# Patient Record
Sex: Male | Born: 1975 | Race: White | Hispanic: No | Marital: Married | Smoking: Current every day smoker
Health system: Southern US, Community
[De-identification: ages and names within clinical notes are randomized; demographics above are authoritative.]

## PROBLEM LIST (undated history)

## (undated) DIAGNOSIS — F329 Major depressive disorder, single episode, unspecified: Secondary | ICD-10-CM

## (undated) DIAGNOSIS — F32A Depression, unspecified: Secondary | ICD-10-CM

## (undated) DIAGNOSIS — K4090 Unilateral inguinal hernia, without obstruction or gangrene, not specified as recurrent: Secondary | ICD-10-CM

## (undated) DIAGNOSIS — K219 Gastro-esophageal reflux disease without esophagitis: Secondary | ICD-10-CM

## (undated) DIAGNOSIS — F1910 Other psychoactive substance abuse, uncomplicated: Secondary | ICD-10-CM

## (undated) HISTORY — PX: HERNIA REPAIR: SHX51

---

## 1997-11-03 ENCOUNTER — Emergency Department (HOSPITAL_COMMUNITY): Admission: EM | Admit: 1997-11-03 | Discharge: 1997-11-03 | Payer: Self-pay | Admitting: Emergency Medicine

## 2009-03-28 ENCOUNTER — Emergency Department (HOSPITAL_COMMUNITY): Admission: EM | Admit: 2009-03-28 | Discharge: 2009-03-29 | Payer: Self-pay | Admitting: Emergency Medicine

## 2009-10-23 ENCOUNTER — Emergency Department (HOSPITAL_COMMUNITY): Admission: EM | Admit: 2009-10-23 | Discharge: 2009-10-23 | Payer: Self-pay | Admitting: Emergency Medicine

## 2010-08-08 LAB — CBC
HCT: 49.2 % (ref 39.0–52.0)
Hemoglobin: 17.1 g/dL — ABNORMAL HIGH (ref 13.0–17.0)
MCHC: 34.8 g/dL (ref 30.0–36.0)
MCV: 93.2 fL (ref 78.0–100.0)
Platelets: 229 10*3/uL (ref 150–400)
RBC: 5.27 MIL/uL (ref 4.22–5.81)
RDW: 13 % (ref 11.5–15.5)
WBC: 6.4 10*3/uL (ref 4.0–10.5)

## 2010-08-08 LAB — LACTIC ACID, PLASMA: Lactic Acid, Venous: 2.3 mmol/L — ABNORMAL HIGH (ref 0.5–2.2)

## 2010-08-08 LAB — URINALYSIS, ROUTINE W REFLEX MICROSCOPIC
Bilirubin Urine: NEGATIVE
Glucose, UA: NEGATIVE mg/dL
Hgb urine dipstick: NEGATIVE
Ketones, ur: NEGATIVE mg/dL
Nitrite: NEGATIVE
Protein, ur: NEGATIVE mg/dL
Specific Gravity, Urine: 1.027 (ref 1.005–1.030)
Urobilinogen, UA: 0.2 mg/dL (ref 0.0–1.0)
pH: 7 (ref 5.0–8.0)

## 2010-08-08 LAB — COMPREHENSIVE METABOLIC PANEL
ALT: 17 U/L (ref 0–53)
Albumin: 4.4 g/dL (ref 3.5–5.2)
Calcium: 9 mg/dL (ref 8.4–10.5)
GFR calc Af Amer: >60 mL/min (ref >60)
Glucose, Bld: 97 mg/dL (ref 70–99)
Potassium: 4.2 mEq/L (ref 3.5–5.1)
Sodium: 142 mEq/L (ref 135–145)
Total Protein: 7.2 g/dL (ref 6.0–8.3)

## 2010-08-08 LAB — ABO/RH: ABO/RH(D): O NEG

## 2010-08-08 LAB — POCT I-STAT, CHEM 8
BUN: 3 mg/dL — ABNORMAL LOW (ref 6–23)
Calcium, Ion: 1.05 mmol/L — ABNORMAL LOW (ref 1.12–1.32)
Creatinine, Ser: 1.1 mg/dL (ref 0.4–1.5)
Hemoglobin: 18.7 g/dL — ABNORMAL HIGH (ref 13.0–17.0)
TCO2: 25 mmol/L (ref 0–100)

## 2010-08-08 LAB — TYPE AND SCREEN
ABO/RH(D): O NEG
Antibody Screen: NEGATIVE

## 2010-08-08 LAB — ETHANOL: Alcohol, Ethyl (B): 264 mg/dL — ABNORMAL HIGH (ref 0–10)

## 2010-08-08 LAB — PROTIME-INR: Prothrombin Time: 13.3 seconds (ref 11.6–15.2)

## 2010-08-21 ENCOUNTER — Emergency Department (HOSPITAL_COMMUNITY)
Admission: EM | Admit: 2010-08-21 | Discharge: 2010-08-21 | Disposition: A | Discharge status: Home / Self Care | Payer: Self-pay | Attending: Emergency Medicine | Admitting: Emergency Medicine

## 2010-08-21 ENCOUNTER — Emergency Department (HOSPITAL_COMMUNITY): Payer: Self-pay

## 2010-08-21 DIAGNOSIS — S0003XA Contusion of scalp, initial encounter: Secondary | ICD-10-CM | POA: Insufficient documentation

## 2010-08-21 DIAGNOSIS — S0990XA Unspecified injury of head, initial encounter: Secondary | ICD-10-CM | POA: Insufficient documentation

## 2010-08-21 DIAGNOSIS — Y929 Unspecified place or not applicable: Secondary | ICD-10-CM | POA: Insufficient documentation

## 2010-08-21 DIAGNOSIS — W19XXXA Unspecified fall, initial encounter: Secondary | ICD-10-CM | POA: Insufficient documentation

## 2010-08-21 DIAGNOSIS — IMO0002 Reserved for concepts with insufficient information to code with codable children: Secondary | ICD-10-CM | POA: Insufficient documentation

## 2010-08-21 DIAGNOSIS — S0083XA Contusion of other part of head, initial encounter: Secondary | ICD-10-CM | POA: Insufficient documentation

## 2010-08-21 DIAGNOSIS — R51 Headache: Secondary | ICD-10-CM | POA: Insufficient documentation
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
## 2012-02-06 ENCOUNTER — Emergency Department (HOSPITAL_COMMUNITY)
Admission: EM | Admit: 2012-02-06 | Discharge: 2012-02-06 | Disposition: A | Discharge status: Home / Self Care | Payer: Self-pay | Attending: Emergency Medicine | Admitting: Emergency Medicine

## 2012-02-06 ENCOUNTER — Encounter (HOSPITAL_COMMUNITY): Payer: Self-pay | Admitting: Emergency Medicine

## 2012-02-06 DIAGNOSIS — R112 Nausea with vomiting, unspecified: Secondary | ICD-10-CM | POA: Insufficient documentation

## 2012-02-06 DIAGNOSIS — F17200 Nicotine dependence, unspecified, uncomplicated: Secondary | ICD-10-CM | POA: Insufficient documentation

## 2012-02-06 DIAGNOSIS — K4090 Unilateral inguinal hernia, without obstruction or gangrene, not specified as recurrent: Secondary | ICD-10-CM

## 2012-02-06 DIAGNOSIS — K4020 Bilateral inguinal hernia, without obstruction or gangrene, not specified as recurrent: Secondary | ICD-10-CM | POA: Insufficient documentation

## 2012-02-06 MED ORDER — HYDROCODONE-ACETAMINOPHEN 5-325 MG PO TABS: 1.0000 | ORAL_TABLET | Freq: Four times a day (QID) | ORAL | Status: DC | PRN

## 2012-02-06 NOTE — ED Provider Notes (Signed)
History     CSN: [id number]  Arrival date & time 02/06/12  1601   First MD Initiated Contact with Patient 02/06/12 1659      Chief Complaint  Patient presents with  . Groin Pain    (Consider location/radiation/quality/duration/timing/severity/associated sxs/prior treatment) Patient is a 36 y.o. male presenting with groin pain. The history is provided by the patient.  Groin Pain This is a new problem. The current episode started more than 1 week ago (About 2-1/2 weeks ago.). The problem has not changed since onset.Associated symptoms include abdominal pain. Pertinent negatives include no chest pain, no headaches and no shortness of breath.   Patient status post bilateral hernia repairs as a child please it was somewhere around 70 months of age. Over the past 2 weeks he's noted bilateral groin swelling with abdominal discomfort. States it has been some episodes of nausea and has vomited 4 or 5 times. States the bulges to go down somewhat he lays down. Pain at worse is an 8/10. Pain described as an ache with occasional sharp pain. Nonradiating.  History reviewed. No pertinent past medical history.  Past Surgical History  Procedure Date  . Hernia repair     when he was a child    History reviewed. No pertinent family history.  History  Substance Use Topics  . Smoking status: Current Every Day Smoker -- 1.0 packs/day for 15 years    Types: Cigarettes  . Smokeless tobacco: Not on file  . Alcohol Use: Yes     occassionally      Review of Systems  Constitutional: Negative for fever.  HENT: Negative for neck pain.   Eyes: Positive for redness.  Respiratory: Negative for shortness of breath.   Cardiovascular: Negative for chest pain.  Gastrointestinal: Positive for abdominal pain.  Genitourinary: Negative for dysuria, hematuria and testicular pain.  Musculoskeletal: Negative for back pain.  Skin: Negative for rash.  Neurological: Negative for headaches.  Hematological: Does  not bruise/bleed easily.    Allergies  Review of patient's allergies indicates no known allergies.  Home Medications   Current Outpatient Rx  Name Route Sig Dispense Refill  . BC HEADACHE POWDER PO Oral Take 1 packet by mouth daily as needed.    Marland Kitchen HYDROCODONE-ACETAMINOPHEN 5-325 MG PO TABS Oral Take 1-2 tablets by mouth every 6 (six) hours as needed for pain. 14 tablet 0    BP 143/84  Pulse 96  Temp 98.5 F (36.9 C) (Oral)  Resp 18  SpO2 96%  Physical Exam  Nursing note and vitals reviewed. Constitutional: He is oriented to person, place, and time. He appears well-developed and well-nourished.  HENT:  Head: Normocephalic and atraumatic.  Mouth/Throat: Oropharynx is clear and moist.  Eyes: Conjunctivae normal and EOM are normal. Pupils are equal, round, and reactive to light.  Neck: Normal range of motion. Neck supple.  Cardiovascular: Normal rate, regular rhythm and normal heart sounds.   Pulmonary/Chest: He is in respiratory distress.  Abdominal: Soft. Bowel sounds are normal. There is no tenderness.  Genitourinary: Penis normal.       Testes nontender no mass. Bilateral inguinal swelling easily reducible does not project down into the inguinal canal. Consistent with early bilateral hernias.  Musculoskeletal: Normal range of motion. He exhibits no edema.  Neurological: He is alert and oriented to person, place, and time. No cranial nerve deficit. He exhibits normal muscle tone. Coordination normal.  Skin: Skin is warm. No rash noted.    ED Course  Procedures (including critical  care time)   Labs Reviewed  URINALYSIS, ROUTINE W REFLEX MICROSCOPIC   No results found.   1. Left groin hernia   2. Right groin hernia       MDM   Patient with bilateral inguinal groin bulges hernia does not come down into the inguinal canal they are easily reducible no evidence of incarceration. They may not even be at the point where they require surgical repair however will refer  patient to general surgery for consultation.        Shelda Jakes, MD 02/06/12 1739

## 2012-02-06 NOTE — ED Notes (Addendum)
Pt.ststaed that he has L lower abdominal/groin pain with a bulge in that area.  Pain in the region for approximately one week and a half. Noticed initially in shower and appeared larger on one side than other.  Pain at this time stabbing and comes and goes.  Pt. rates pain at this time at "6" out of "10".  Some redness to area noted,  Pt. does not appear in great discomfort.

## 2012-02-10 ENCOUNTER — Ambulatory Visit (INDEPENDENT_AMBULATORY_CARE_PROVIDER_SITE_OTHER): Payer: Self-pay | Admitting: General Surgery

## 2012-02-10 ENCOUNTER — Encounter (HOSPITAL_COMMUNITY): Payer: Self-pay

## 2012-02-10 ENCOUNTER — Emergency Department (HOSPITAL_COMMUNITY)
Admission: EM | Admit: 2012-02-10 | Discharge: 2012-02-10 | Disposition: A | Discharge status: Home / Self Care | Payer: Self-pay | Attending: Emergency Medicine | Admitting: Emergency Medicine

## 2012-02-10 DIAGNOSIS — F17200 Nicotine dependence, unspecified, uncomplicated: Secondary | ICD-10-CM | POA: Insufficient documentation

## 2012-02-10 DIAGNOSIS — K4090 Unilateral inguinal hernia, without obstruction or gangrene, not specified as recurrent: Secondary | ICD-10-CM | POA: Insufficient documentation

## 2012-02-10 HISTORY — DX: Unilateral inguinal hernia, without obstruction or gangrene, not specified as recurrent: K40.90

## 2012-02-10 NOTE — ED Notes (Signed)
Patient given discharge instructions, information, prescriptions, and diet order. Patient states that they adequately understand discharge information given and to return to ED if symptoms return or worsen.     

## 2012-02-10 NOTE — ED Notes (Signed)
Patient reports that he ws seen at [facility] 5 days ago and stated that the hernia was reduced. Patient states he was referred to a surgeon and can not be seen until 02/20/12. Patient states he has also had vomiting x 3 and diarrhea today. Patient reports that the pain is worse and the area of pain has increased.

## 2012-02-10 NOTE — ED Provider Notes (Signed)
History     CSN: [id number]  Arrival date & time 02/10/12  1408   First MD Initiated Contact with Patient 02/10/12 1803      Chief Complaint  Patient presents with  . Inguinal Hernia    (Consider location/radiation/quality/duration/timing/severity/associated sxs/prior treatment) HPI  36 year old male with hx of inguinal hernia that he has since 32 month of age, and has had prior hernia surgical repair.  He is here c/o pain to L inguinal region for 1-2 weeks.  Describe pain as a sharp and throbbing sensation lasting 10-15 min to 30-40 min.  Pain is associated with a bulge, which improves when he lays down.  He also endorse several bouts of nausea, vomiting, and diaphoresis with associated pain.  He denies fever, chills, cp, sob, back pain, testicle pain, dysuria, or rash.  Was seen in ED 5 days ago for same.  Was diagnosed with inguinal hernia and recommend f/u with [facility].  Pt sts he does not have insurance, and unable to pay for the copay for follow up. He also mentioned that pain has progressed.  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  Pt has tried pain medication prescribed without relief.    Past Medical History  Diagnosis Date  . Inguinal hernia     Past Surgical History  Procedure Date  . Hernia repair     when he was a child    History reviewed. No pertinent family history.  History  Substance Use Topics  . Smoking status: Current Every Day Smoker -- 1.0 packs/day for 15 years    Types: Cigarettes  . Smokeless tobacco: Never Used  . Alcohol Use: Yes     occassionally      Review of Systems  All other systems reviewed and are negative.    Allergies  Review of patient's allergies indicates no known allergies.  Home Medications   Current Outpatient Rx  Name Route Sig Dispense Refill  . BC HEADACHE POWDER PO Oral Take 1 packet by mouth daily as needed.    Marland Kitchen  HYDROCODONE-ACETAMINOPHEN 5-325 MG PO TABS Oral Take 1-2 tablets by mouth every 6 (six) hours as needed.      BP 125/67  Pulse 67  Temp 98.1 F (36.7 C) (Oral)  Resp 16  SpO2 100%  Physical Exam  Nursing note and vitals reviewed. Constitutional: He appears well-developed and well-nourished. No distress.       Awake, alert, nontoxic appearance  HENT:  Head: Atraumatic.  Eyes: Conjunctivae normal are normal. Right eye exhibits no discharge. Left eye exhibits no discharge.  Neck: Normal range of motion. Neck supple.  Cardiovascular: Normal rate and regular rhythm.   Pulmonary/Chest: Effort normal. No respiratory distress. He exhibits no tenderness.  Abdominal: Soft. There is no tenderness. There is no rebound. A hernia is present. Hernia confirmed positive in the left inguinal area (reducible direct inguinal hernia, ttp.  ).    Genitourinary: Testes normal and penis normal. Right testis shows no swelling and no tenderness. Left testis shows no swelling and no tenderness. Circumcised. No penile tenderness.  Musculoskeletal: He exhibits no tenderness.       ROM appears intact, no obvious focal weakness  Lymphadenopathy:       Right: Inguinal adenopathy present.       Left: Inguinal adenopathy present.  Neurological: He is alert.  Skin: Skin is warm and dry. No rash  noted.  Psychiatric: He has a normal mood and affect.    ED Course  Procedures (including critical care time)  Labs Reviewed - No data to display No results found.   No diagnosis found.  1. L inguinal hernia  MDM  Pt has direct L inguinal hernia that is spontaneously reducible.  Hernia noticeable only when coughed.  No GU involvement.  Pt however had increased pain x 1-2 weeks and unable to afford copay to be seen by [facility].  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    6:57 PM I discussed with my attending, who has evaluated pt and felt that pt should only f/u with [facility] for  further management.  Pt does not meet criteria for admission at this time. There is no redness or warmth overlying the hernia. Therefore, will refer to [facility] for further care. I continue to stress no heavy lifting until seen by surgery.  Pt stable to be d/c at this time.   BP 125/67  Pulse 67  Temp 98.1 F (36.7 C) (Oral)  Resp 16  SpO2 100%  Nursing notes reviewed and considered in documentation  Previous records reviewed and considered      Fayrene Helper, PA-C 02/10/12 1900

## 2012-02-10 NOTE — ED Provider Notes (Signed)
Patient with left sided inguinal hernia. Reports he last vomited 2 days ago. Last bowel movement this morning which was loose. On exam patient is alert nontoxic. Abdomen soft nontender there is a left-sided inguinal hernia which reduces spontaneously when patient lies supine. There is no redness or warmth overlying the hernia and no signs of incarceration genitalia normal male, circumcised Plan referral [facility]  Doug Sou, MD 02/10/12 1859

## 2012-02-10 NOTE — ED Notes (Signed)
Pt sts he has noticed that the past 3 weeks he has been having trouble in his left inguinal area. Sts he noticed the pain 2 weeks ago that has gotten worse. Patient sts he had episodes of nausea and vomiting this past Saturday but none since. Pt sts he has been seen at [facility] but doesn't have the funds to pay upfront.

## 2012-02-11 ENCOUNTER — Encounter (INDEPENDENT_AMBULATORY_CARE_PROVIDER_SITE_OTHER): Payer: Self-pay | Admitting: Surgery

## 2012-02-11 NOTE — ED Provider Notes (Signed)
Medical screening examination/treatment/procedure(s) were conducted as a shared visit with non-physician practitioner(s) and myself.  I personally evaluated the patient during the encounter  Doug Sou, MD 02/11/12 0030

## 2012-02-12 ENCOUNTER — Ambulatory Visit (INDEPENDENT_AMBULATORY_CARE_PROVIDER_SITE_OTHER): Payer: Self-pay | Admitting: General Surgery

## 2012-02-12 ENCOUNTER — Encounter (INDEPENDENT_AMBULATORY_CARE_PROVIDER_SITE_OTHER): Payer: Self-pay | Admitting: General Surgery

## 2012-02-12 VITALS — BP 136/84 | HR 79 | Temp 98.0°F | Resp 18 | Ht 74.0 in | Wt 157.8 lb

## 2012-02-12 DIAGNOSIS — K4090 Unilateral inguinal hernia, without obstruction or gangrene, not specified as recurrent: Secondary | ICD-10-CM

## 2012-02-12 NOTE — Progress Notes (Signed)
Patient ID: Adam Pierce, male   DOB: 04-16-1976, 36 y.o.   MRN: [id number]  Chief Complaint  Patient presents with  . New Evaluation    lft herina    HPI Adam Pierce is a 36 y.o. male presents for evaluation of L groin bulge x 1 month. He first noticed the mass in the shower. One week later he developed pain and enlargement of the mass. 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 He admits to nausea, worsening pain and emesis. He denies fever and constipation. He has been out of work due to pain and fatigue for 10 days. He presented to the [facility] ED 5 days ago where he was told he had a L inguinal hernia, prescribed 14 Vicodin and advised to f/u with general surgery. He does have a history of open RIH as a child.    HPI  Past Medical History  Diagnosis Date  . Inguinal hernia     Past Surgical History  Procedure Date  . Hernia repair 1978    6-8 mos     No family history on file.  Social History History  Substance Use Topics  . Smoking status: Current Every Day Smoker -- 1.0 packs/day for 15 years    Types: Cigarettes  . Smokeless tobacco: Never Used  . Alcohol Use: Yes     occassionally    No Known Allergies  Current Outpatient Prescriptions  Medication Sig Dispense Refill  . HYDROcodone-acetaminophen (NORCO/VICODIN) 5-325 MG per tablet Take 1-2 tablets by mouth every 6 (six) hours as needed.      . Aspirin-Salicylamide-Caffeine (BC HEADACHE POWDER PO) Take 1 packet by mouth daily as needed.        Review of Systems Review of Systems All other review of systems negative or noncontributory except as stated in the HPI Blood pressure 136/84, pulse 79, temperature 98 F (36.7 C), temperature source Oral, resp. rate 18, height 6\' 2"  (1.88 m), weight 157 lb 12.8 oz (71.578 kg).  Physical Exam Physical Exam Physical Exam  Vitals reviewed. Constitutional: He is oriented to person, place, and time. He appears well-developed and well-nourished.  No distress.  HENT:  Head: Normocephalic and atraumatic.  Mouth/Throat: No oropharyngeal exudate.  Eyes: Conjunctivae and EOM are normal. Pupils are equal, round, and reactive to light. Right eye exhibits no discharge. Left eye exhibits no discharge. No scleral icterus.  Neck: Normal range of motion. No tracheal deviation present.  Cardiovascular: Normal rate, regular rhythm and normal heart sounds.   Pulmonary/Chest: Effort normal and breath sounds normal. No stridor. No respiratory distress. He has no wheezes. He has no rales. He exhibits no tenderness.  Abdominal: Soft. Bowel sounds are normal. He exhibits no distension and no mass. There is no tenderness. There is no rebound and no guarding. he has a small LIH on exam and whss on right but no evidence of recurrence of RIH. Musculoskeletal: Normal range of motion. He exhibits no edema and no tenderness.  Neurological: He is alert and oriented to person, place, and time.  Skin: Skin is warm and dry. No rash noted. He is not diaphoretic. No erythema. No pallor.  Psychiatric: He has a normal mood and affect. His behavior is normal. Judgment and thought content normal.    Data Reviewed   Assessment    Left inguinal hernia-reducible He has a fairly small and reducible left inguinal hernia on exam and I'm not entirely certain that this is the source of  all his complaints. This could also be a groin strain an incidentally found inguinal hernia. He is very thin and it is difficult to differentiate  ininguinal hernia versus cough impulse. He has no visible bulge but do think that he has a small hernia on exam. I discussed with him the options for continued observation versus surgical repair and I think that we will likely come to surgery. However, I think it is reasonable to wait for a that a few weeks to see if this improves with rest and NSAIDs. If this is a groin strain, this should improve whereas her hernia would continue to manifest. We  discussed the procedure of open repair versus laparoscopic repair and he is interested in the most economical repair which in this case would be open.  We discussed the risks of procedure including nerve injury and numbness and chronic pain, injury to the vas deferens the testicle and recurrence.  He expressed understanding and we will see how he does with a few weeks of rest. He will call me back if he would like to schedule open left inguinal hernia repair   Plan    Rest and NSAIDs (with food) x1 week and he will let me know if he would like to have repair.       Lodema Pilot DAVID 02/12/2012, 3:24 PM

## 2012-02-15 IMAGING — CT CT HEAD W/O CM
1 series · 16 of 30 positions shown, 20 images · non-contrast
Comparison: 03/29/2009

CLINICAL DATA: Fell with laceration and contusion on the forehead.
Swelling on the back of the head.

CT HEAD WITHOUT CONTRAST
TECHNIQUE: Contiguous axial images were obtained from the base of
the skull through the vertex without contrast.

[Series 2: head trauma 4.8 h37s · axial · 0.43mm/px · z∈[-116,+38]mm · 16 of 36 slices shown, 20 images]
[im 2/36  brain]
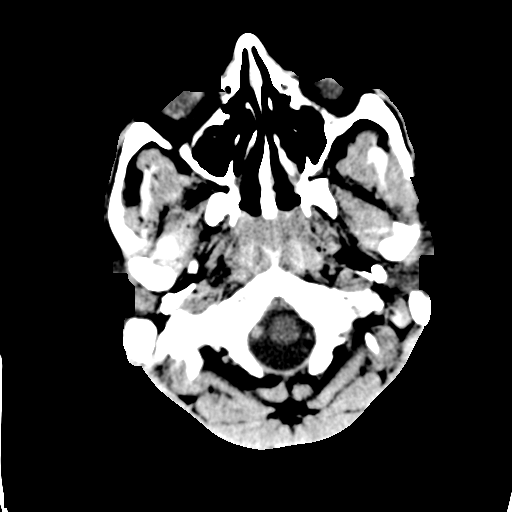
[im 2/36  bone]
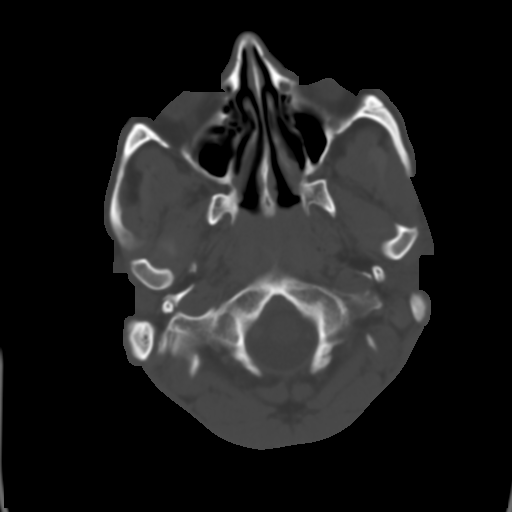
[im 4/36  brain]
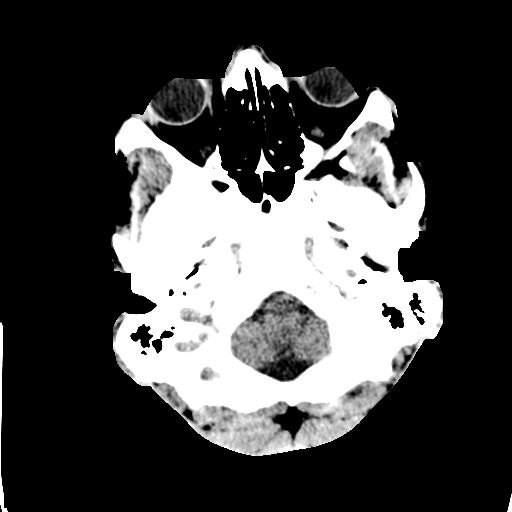
[im 7/36  brain]
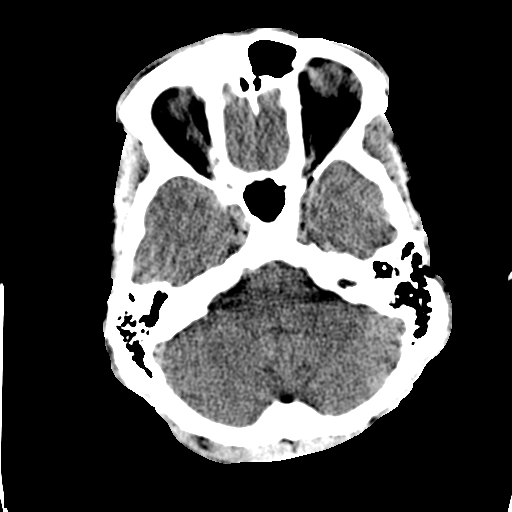
[im 9/36  brain]
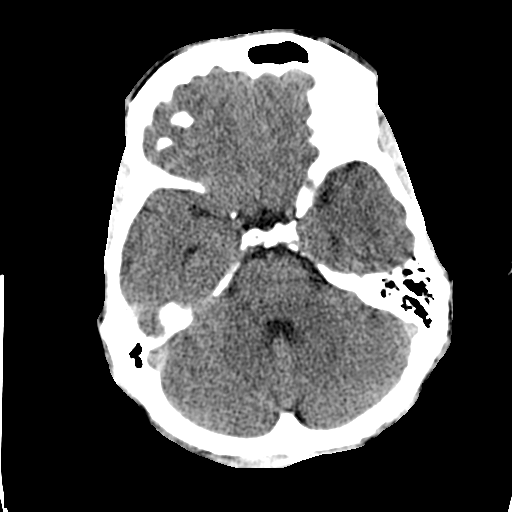
[im 10/36  brain]
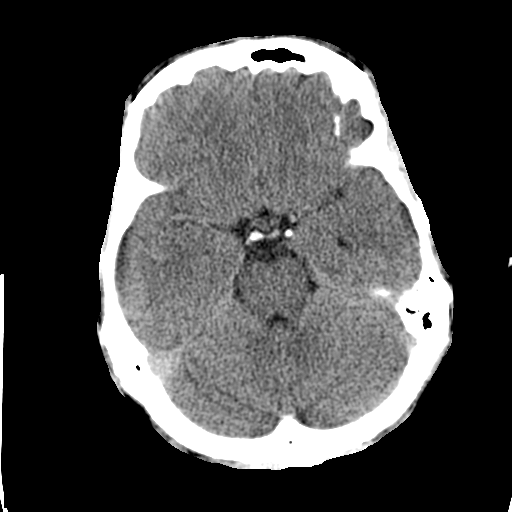
[im 10/36  bone]
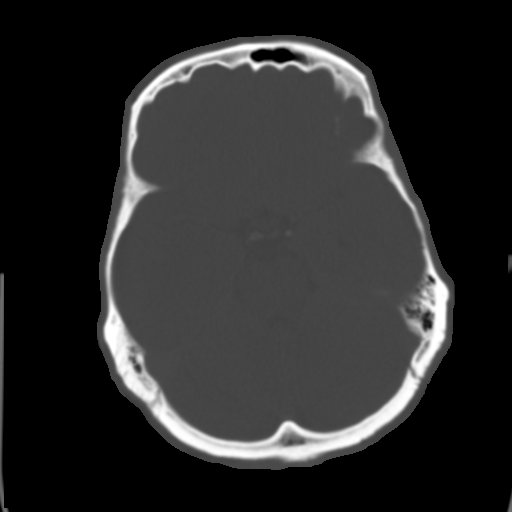
[im 13/36  brain]
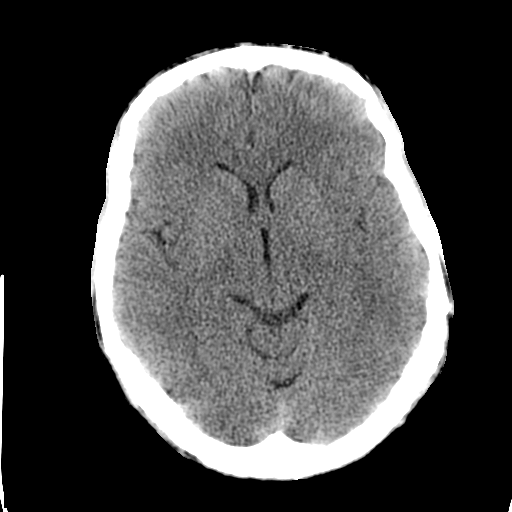
[im 15/36  brain]
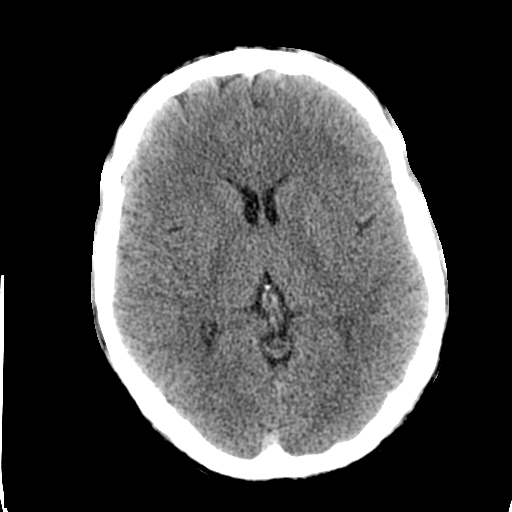
[im 17/36  brain]
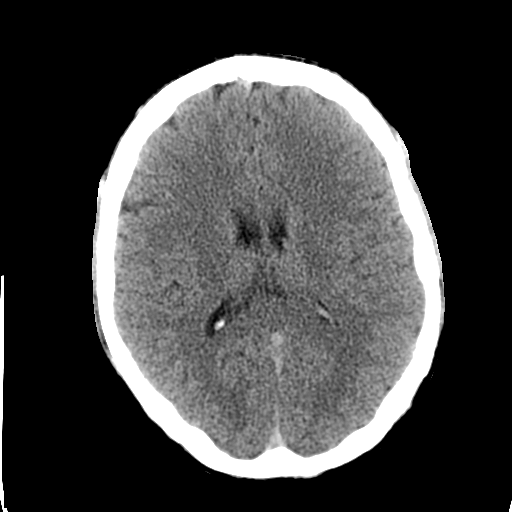
[im 19/36  brain]
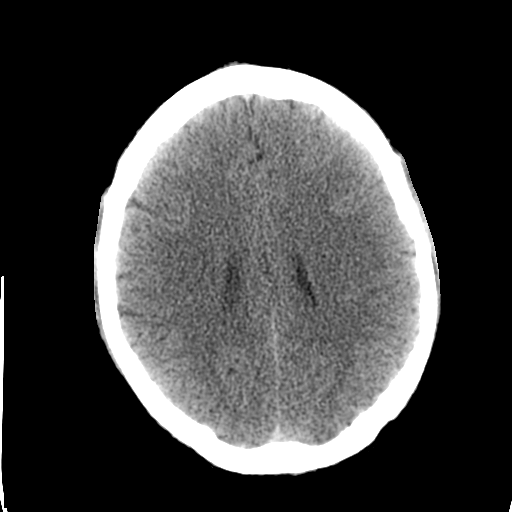
[im 19/36  bone]
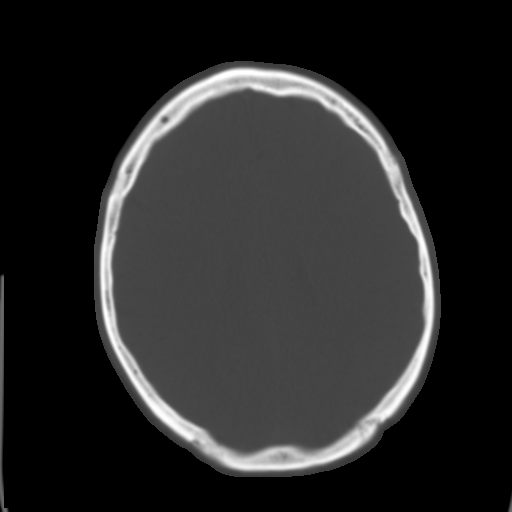
[im 21/36  brain]
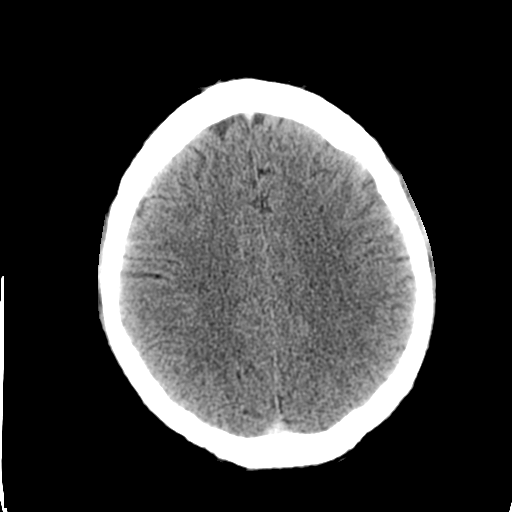
[im 23/36  brain]
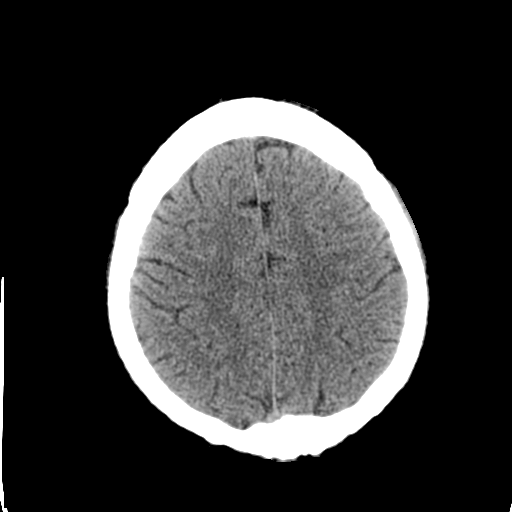
[im 26/36  brain]
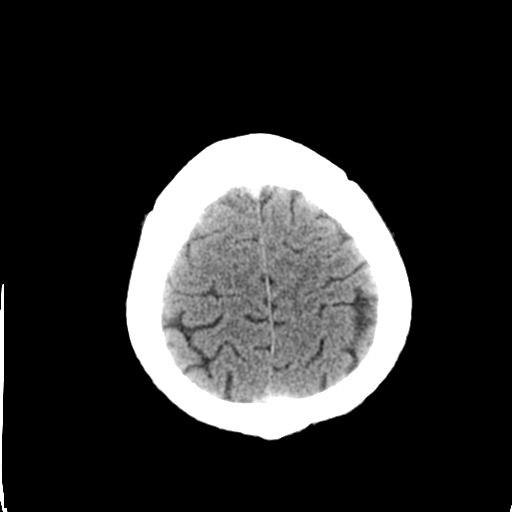
[im 27/36  brain]
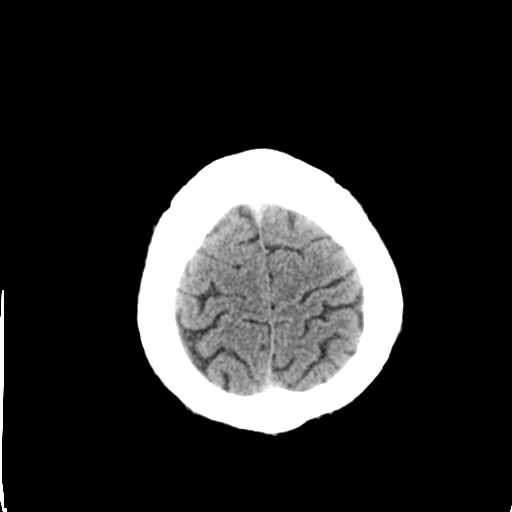
[im 27/36  bone]
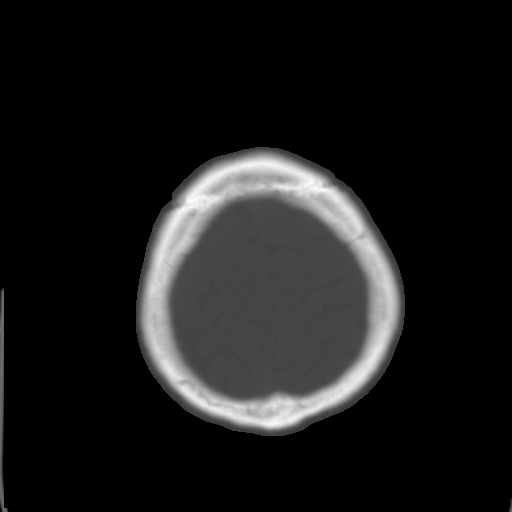
[im 29/36  brain]
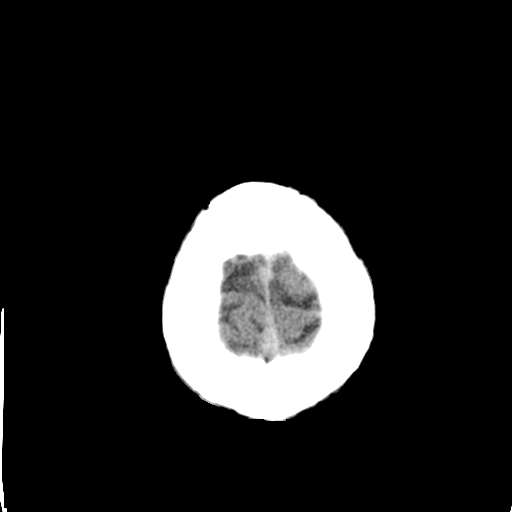
[im 32/36  brain]
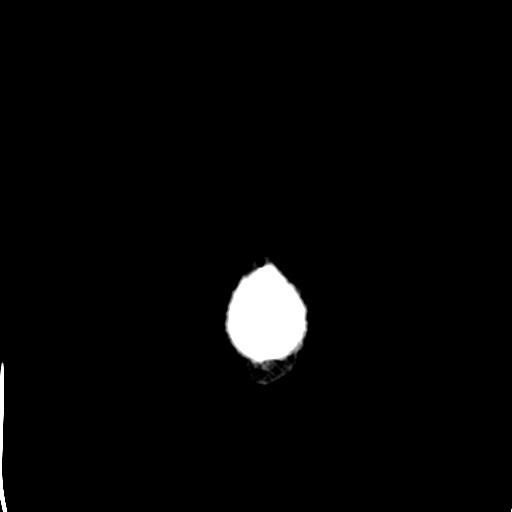
[im 34/36  brain]
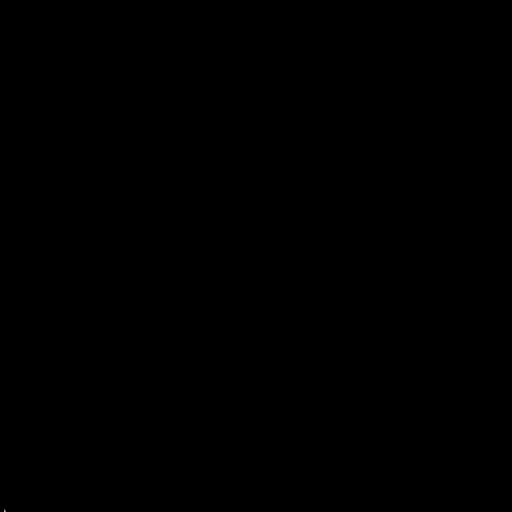

[16 of 30 positions shown; findings below may reference images not displayed]

FINDINGS: The ventricles and sulci are symmetrical without
significant effacement, displacement, or dilatation. No mass effect
or midline shift. No abnormal extra-axial fluid collections. The
grey-white matter junction is distinct. Basal cisterns are not
effaced. No acute intracranial hemorrhage. No depressed skull
fractures.  Visualized paranasal sinuses are not opacified.  No
significant change since the previous study.
IMPRESSION: No evidence of acute intracranial hemorrhage, mass lesion, or acute
infarct.  No significant change since prior study.

## 2012-02-20 ENCOUNTER — Ambulatory Visit (INDEPENDENT_AMBULATORY_CARE_PROVIDER_SITE_OTHER): Payer: Self-pay | Admitting: General Surgery

## 2013-03-09 ENCOUNTER — Telehealth (INDEPENDENT_AMBULATORY_CARE_PROVIDER_SITE_OTHER): Payer: Self-pay | Admitting: Surgery

## 2013-03-09 NOTE — Telephone Encounter (Signed)
Pt called our answering service.  ?seen last year for painful inguinal hernia.  Surgery recommended by Dr. Biagio Quint but does not look like it was done.  No answer on call -  Left message on answering machine to call [facility] office   Ardeth Sportsman, M.D., F.A.C.S. Gastrointestinal and Minimally Invasive Surgery [facility] [address] [place], [place] [address] [phone] Main / Paging

## 2013-03-11 ENCOUNTER — Encounter (INDEPENDENT_AMBULATORY_CARE_PROVIDER_SITE_OTHER): Payer: Self-pay | Admitting: General Surgery

## 2013-03-11 ENCOUNTER — Encounter (INDEPENDENT_AMBULATORY_CARE_PROVIDER_SITE_OTHER): Payer: Self-pay

## 2013-03-11 ENCOUNTER — Ambulatory Visit (INDEPENDENT_AMBULATORY_CARE_PROVIDER_SITE_OTHER): Payer: 59 | Admitting: General Surgery

## 2013-03-11 ENCOUNTER — Other Ambulatory Visit (INDEPENDENT_AMBULATORY_CARE_PROVIDER_SITE_OTHER): Payer: Self-pay | Admitting: General Surgery

## 2013-03-11 VITALS — BP 120/76 | HR 72 | Resp 16 | Ht 74.0 in | Wt 168.2 lb

## 2013-03-11 DIAGNOSIS — K4091 Unilateral inguinal hernia, without obstruction or gangrene, recurrent: Secondary | ICD-10-CM

## 2013-03-11 MED ORDER — HYDROCODONE-ACETAMINOPHEN 5-325 MG PO TABS: 1.0000 | ORAL_TABLET | Freq: Four times a day (QID) | ORAL | Status: DC | PRN

## 2013-03-11 NOTE — Progress Notes (Signed)
Patient ID: Adam Pierce, male   DOB: 08/16/1975, 37 y.o.   MRN: [id number]  Chief Complaint  Patient presents with  . Inguinal Hernia    HPI Adam Pierce is a 37 y.o. male. This patient is known to me for prior evaluation of a left inguinal hernia. He first noticed this last October but his symptoms resolved with rest and he said he did not have the money at the time for surgery. He said recently he lifted up a heavy propane bottle and he began having recurrent pain similar to before in his left groin and has noticed the bulge again. He says this causes severe sharp pains in the left groin and he's been taking Naprosyn for relief which helps dull the pain.  He does do a lot of lifting for work and is interested in repair as this does limit him.. He does have a history of prior bilateral inguinal hernia repairs as a child HPI  Past Medical History  Diagnosis Date  . Inguinal hernia     Past Surgical History  Procedure Laterality Date  . Hernia repair  1978    6-8 mos     History reviewed. No pertinent family history.  Social History History  Substance Use Topics  . Smoking status: Current Every Day Smoker -- 1.00 packs/day for 15 years    Types: Cigarettes  . Smokeless tobacco: Never Used  . Alcohol Use: Yes     Comment: occassionally    No Known Allergies  Current Outpatient Prescriptions  Medication Sig Dispense Refill  . Aspirin-Salicylamide-Caffeine (BC HEADACHE POWDER PO) Take 1 packet by mouth daily as needed.      . naproxen (NAPROSYN) 250 MG tablet Take by mouth 2 (two) times daily with a meal.       No current facility-administered medications for this visit.    Review of Systems Review of Systems All other review of systems negative or noncontributory except as stated in the HPI  Blood pressure 120/76, pulse 72, resp. rate 16, height 6' 2" (1.88 m), weight 168 lb 3.2 oz (76.295 kg).  Physical Exam Physical Exam Physical Exam  Vitals  reviewed. Constitutional: He is oriented to person, place, and time. He appears well-developed and well-nourished. No distress.  HENT:  Head: Normocephalic and atraumatic.  Mouth/Throat: No oropharyngeal exudate.  Eyes: Conjunctivae and EOM are normal. Pupils are equal, round, and reactive to light. Right eye exhibits no discharge. Left eye exhibits no discharge. No scleral icterus.  Neck: Normal range of motion. No tracheal deviation present.  Cardiovascular: Normal rate, regular rhythm and normal heart sounds.   Pulmonary/Chest: Effort normal and breath sounds normal. No stridor. No respiratory distress. He has no wheezes. He has no rales. He exhibits no tenderness.  Abdominal: Soft. Bowel sounds are normal. He exhibits no distension and no mass. There is no tenderness. There is no rebound and no guarding. whss bilat groins.  No RIH.  Small and reducible LIH, tender Musculoskeletal: Normal range of motion. He exhibits no edema and no tenderness.  Neurological: He is alert and oriented to person, place, and time.  Skin: Skin is warm and dry. No rash noted. He is not diaphoretic. No erythema. No pallor.  Psychiatric: He has a normal mood and affect. His behavior is normal. Judgment and thought content normal.    Data Reviewed   Assessment    Recurrent left inguinal hernia  he appears to have a recurrent left inguinal hernia on exam. This is   a small bulge with Valsalva but it is tender. He has prior groin incisions consistent with prior hernia repairs as an infant and so I discussed with him the options for continued observation versus open and laparoscopic repair. I think that given his prior open repairs, I would recommend and plan for laparoscopic left inguinal hernia repair. We discussed the procedure and its risks. We discussed the risk of infection, bleeding, pain, scarring, persistent symptoms, nerve injury, need for open surgery, injury to the vas deferens or testicles, and recurrence  and he expressed understanding and desire to proceed with laparoscopic left inguinal hernia repair with mesh    Plan    We will schedule him for a laparoscopic left inguinal hernia repair with mesh as soon as available        Frances Ambrosino DAVID 03/11/2013, 11:38 AM    

## 2013-03-12 ENCOUNTER — Encounter (HOSPITAL_COMMUNITY)
Admission: RE | Admit: 2013-03-12 | Discharge: 2013-03-12 | Disposition: A | Discharge status: Home / Self Care | Payer: 59 | Source: Ambulatory Visit | Attending: General Surgery | Admitting: General Surgery

## 2013-03-12 ENCOUNTER — Encounter (HOSPITAL_COMMUNITY): Payer: Self-pay

## 2013-03-12 ENCOUNTER — Encounter (HOSPITAL_COMMUNITY): Payer: Self-pay | Admitting: Pharmacy Technician

## 2013-03-12 VITALS — BP 120/80 | HR 78 | Temp 98.6°F | Resp 18 | Ht 74.0 in | Wt 166.4 lb

## 2013-03-12 DIAGNOSIS — K4091 Unilateral inguinal hernia, without obstruction or gangrene, recurrent: Secondary | ICD-10-CM

## 2013-03-12 DIAGNOSIS — Z01818 Encounter for other preprocedural examination: Secondary | ICD-10-CM | POA: Insufficient documentation

## 2013-03-12 HISTORY — DX: Gastro-esophageal reflux disease without esophagitis: K21.9

## 2013-03-12 NOTE — Patient Instructions (Addendum)
21 Adam Pierce  03/12/2013   Your procedure is scheduled on:  11-13 -2014  Report to [facility] at    0730    AM.  Call this number if you have problems the morning of surgery: [phone]  Or Presurgical Testing [phone](Mickel Schreur)     Do not eat food:After Midnight.    Take these medicines the morning of surgery with A SIP OF WATER: Hydrocodone   Do not wear jewelry, make-up or nail polish.  Do not wear lotions, powders, or perfumes. You may wear deodorant.  Do not shave 12 hours prior to first CHG shower(legs and under arms).(face and neck okay.)  Do not bring valuables to the hospital.  Contacts, dentures or removable bridgework, body piercing, hair pins may not be worn into surgery.  Leave suitcase in the car. After surgery it may be brought to your room.  For patients admitted to the hospital, checkout time is 11:00 AM the day of discharge.   Patients discharged the day of surgery will not be allowed to drive home. Must have responsible person with you x 24 hours once discharged.  Name and phone number of your driver: [name] [phone]- [phone] cell Special Instructions: CHG(Chlorhedine 4%-"Hibiclens","Betasept","Aplicare") Shower Use Special Wash: see special instructions.(avoid face and genitals)    Failure to follow these instructions may result in Cancellation of your surgery.   Patient signature_______________________________________________________

## 2013-03-18 ENCOUNTER — Encounter (HOSPITAL_COMMUNITY): Payer: 59 | Admitting: *Deleted

## 2013-03-18 ENCOUNTER — Encounter (HOSPITAL_COMMUNITY)
Admission: RE | Disposition: A | Discharge status: Home / Self Care | Payer: Self-pay | Source: Ambulatory Visit | Attending: General Surgery

## 2013-03-18 ENCOUNTER — Ambulatory Visit (HOSPITAL_COMMUNITY): Payer: 59 | Admitting: *Deleted

## 2013-03-18 ENCOUNTER — Ambulatory Visit (HOSPITAL_COMMUNITY)
Admission: RE | Admit: 2013-03-18 | Discharge: 2013-03-18 | Disposition: A | Discharge status: Home / Self Care | Payer: 59 | Source: Ambulatory Visit | Attending: General Surgery | Admitting: General Surgery

## 2013-03-18 ENCOUNTER — Encounter (HOSPITAL_COMMUNITY): Payer: Self-pay | Admitting: *Deleted

## 2013-03-18 DIAGNOSIS — F17200 Nicotine dependence, unspecified, uncomplicated: Secondary | ICD-10-CM | POA: Insufficient documentation

## 2013-03-18 DIAGNOSIS — K4090 Unilateral inguinal hernia, without obstruction or gangrene, not specified as recurrent: Secondary | ICD-10-CM | POA: Insufficient documentation

## 2013-03-18 DIAGNOSIS — K4091 Unilateral inguinal hernia, without obstruction or gangrene, recurrent: Secondary | ICD-10-CM

## 2013-03-18 DIAGNOSIS — K219 Gastro-esophageal reflux disease without esophagitis: Secondary | ICD-10-CM | POA: Insufficient documentation

## 2013-03-18 HISTORY — PX: INSERTION OF MESH: SHX5868

## 2013-03-18 HISTORY — PX: INGUINAL HERNIA REPAIR: SHX194

## 2013-03-18 SURGERY — REPAIR, HERNIA, INGUINAL, LAPAROSCOPIC
Anesthesia: General | Site: Groin | Laterality: Left | Wound class: Clean

## 2013-03-18 MED ORDER — CEFAZOLIN SODIUM-DEXTROSE 2-3 GM-% IV SOLR
INTRAVENOUS | Status: AC
  Filled 2013-03-18: qty 50

## 2013-03-18 MED ORDER — DEXAMETHASONE SODIUM PHOSPHATE 4 MG/ML IJ SOLN
INTRAMUSCULAR | Status: DC | PRN
  Administered 2013-03-18: 10 mg via INTRAVENOUS

## 2013-03-18 MED ORDER — HYDROCODONE-ACETAMINOPHEN 5-325 MG PO TABS: 1.0000 | ORAL_TABLET | ORAL | Status: DC | PRN

## 2013-03-18 MED ORDER — TISSEEL VH 10 ML EX KIT
PACK | CUTANEOUS | Status: DC | PRN
  Administered 2013-03-18: 1

## 2013-03-18 MED ORDER — KETAMINE HCL 10 MG/ML IJ SOLN
INTRAMUSCULAR | Status: DC | PRN
  Administered 2013-03-18: 50 mg via INTRAVENOUS

## 2013-03-18 MED ORDER — LIDOCAINE-EPINEPHRINE 1 %-1:100000 IJ SOLN
INTRAMUSCULAR | Status: DC | PRN
  Administered 2013-03-18: 20 mL

## 2013-03-18 MED ORDER — MIDAZOLAM HCL 5 MG/5ML IJ SOLN
INTRAMUSCULAR | Status: DC | PRN
  Administered 2013-03-18: 2 mg via INTRAVENOUS

## 2013-03-18 MED ORDER — FENTANYL CITRATE 0.05 MG/ML IJ SOLN
25.0000 ug | INTRAMUSCULAR | Status: DC | PRN
  Administered 2013-03-18: 50 ug via INTRAVENOUS
  Administered 2013-03-18 (×2): 25 ug via INTRAVENOUS

## 2013-03-18 MED ORDER — LACTATED RINGERS IV SOLN
INTRAVENOUS | Status: DC
  Administered 2013-03-18: 1000 mL via INTRAVENOUS
  Administered 2013-03-18 (×2): via INTRAVENOUS

## 2013-03-18 MED ORDER — FENTANYL CITRATE 0.05 MG/ML IJ SOLN
INTRAMUSCULAR | Status: AC
  Filled 2013-03-18: qty 2

## 2013-03-18 MED ORDER — TISSEEL VH 10 ML EX KIT
PACK | CUTANEOUS | Status: AC
  Filled 2013-03-18: qty 1

## 2013-03-18 MED ORDER — ALBUTEROL SULFATE HFA 108 (90 BASE) MCG/ACT IN AERS
INHALATION_SPRAY | RESPIRATORY_TRACT | Status: DC | PRN
  Administered 2013-03-18: 5 via RESPIRATORY_TRACT

## 2013-03-18 MED ORDER — ONDANSETRON HCL 4 MG/2ML IJ SOLN
INTRAMUSCULAR | Status: DC | PRN
  Administered 2013-03-18: 4 mg via INTRAVENOUS

## 2013-03-18 MED ORDER — 0.9 % SODIUM CHLORIDE (POUR BTL) OPTIME
TOPICAL | Status: DC | PRN
  Administered 2013-03-18: 1000 mL

## 2013-03-18 MED ORDER — BUPIVACAINE HCL (PF) 0.25 % IJ SOLN
INTRAMUSCULAR | Status: AC
  Filled 2013-03-18: qty 30

## 2013-03-18 MED ORDER — PROPOFOL 10 MG/ML IV BOLUS
INTRAVENOUS | Status: DC | PRN
  Administered 2013-03-18: 200 mg via INTRAVENOUS

## 2013-03-18 MED ORDER — METOCLOPRAMIDE HCL 5 MG/ML IJ SOLN
INTRAMUSCULAR | Status: DC | PRN
  Administered 2013-03-18: 10 mg via INTRAVENOUS

## 2013-03-18 MED ORDER — GLYCOPYRROLATE 0.2 MG/ML IJ SOLN
INTRAMUSCULAR | Status: DC | PRN
  Administered 2013-03-18: 0.6 mg via INTRAVENOUS

## 2013-03-18 MED ORDER — HYDROCODONE-ACETAMINOPHEN 5-325 MG PO TABS
1.0000 | ORAL_TABLET | ORAL | Status: DC | PRN
  Administered 2013-03-18: 1 via ORAL
  Filled 2013-03-18: qty 1

## 2013-03-18 MED ORDER — ROCURONIUM BROMIDE 100 MG/10ML IV SOLN
INTRAVENOUS | Status: DC | PRN
  Administered 2013-03-18: 10 mg via INTRAVENOUS
  Administered 2013-03-18: 50 mg via INTRAVENOUS

## 2013-03-18 MED ORDER — CEFAZOLIN SODIUM-DEXTROSE 2-3 GM-% IV SOLR
2.0000 g | INTRAVENOUS | Status: AC
  Administered 2013-03-18: 2 g via INTRAVENOUS

## 2013-03-18 MED ORDER — NEOSTIGMINE METHYLSULFATE 1 MG/ML IJ SOLN
INTRAMUSCULAR | Status: DC | PRN
  Administered 2013-03-18: 5 mg via INTRAVENOUS

## 2013-03-18 MED ORDER — LIDOCAINE-EPINEPHRINE (PF) 1 %-1:200000 IJ SOLN
INTRAMUSCULAR | Status: AC
  Filled 2013-03-18: qty 10

## 2013-03-18 MED ORDER — HYDROMORPHONE HCL PF 1 MG/ML IJ SOLN
INTRAMUSCULAR | Status: DC | PRN
  Administered 2013-03-18: 2 mg via INTRAVENOUS

## 2013-03-18 MED ORDER — PROMETHAZINE HCL 25 MG/ML IJ SOLN: 6.2500 mg | INTRAMUSCULAR | Status: DC | PRN

## 2013-03-18 MED ORDER — BUPIVACAINE HCL (PF) 0.25 % IJ SOLN
INTRAMUSCULAR | Status: DC | PRN
  Administered 2013-03-18: 20 mL

## 2013-03-18 SURGICAL SUPPLY — 42 items
ADH SKN CLS APL DERMABOND .7 (GAUZE/BANDAGES/DRESSINGS) ×1
APL SRG 32X5 SNPLK LF DISP (MISCELLANEOUS) ×1
APPLIER CLIP 5 13 M/L LIGAMAX5 (MISCELLANEOUS)
APR CLP MED LRG 5 ANG JAW (MISCELLANEOUS)
BALL CTTN LRG ABS STRL LF (GAUZE/BANDAGES/DRESSINGS)
BLADE SURG SZ12 CARB STEEL (BLADE) ×2 IMPLANT
CABLE HIGH FREQUENCY MONO STRZ (ELECTRODE) IMPLANT
CANISTER SUCTION 2500CC (MISCELLANEOUS) ×1 IMPLANT
CHLORAPREP W/TINT 26ML (MISCELLANEOUS) ×2 IMPLANT
CLIP APPLIE 5 13 M/L LIGAMAX5 (MISCELLANEOUS) IMPLANT
CLOTH BEACON ORANGE TIMEOUT ST (SAFETY) ×1 IMPLANT
COTTONBALL LRG STERILE PKG (GAUZE/BANDAGES/DRESSINGS) ×1 IMPLANT
DECANTER SPIKE VIAL GLASS SM (MISCELLANEOUS) ×2 IMPLANT
DERMABOND ADVANCED (GAUZE/BANDAGES/DRESSINGS) ×1
DERMABOND ADVANCED .7 DNX12 (GAUZE/BANDAGES/DRESSINGS) ×1 IMPLANT
DEVICE SECURE STRAP 25 ABSORB (INSTRUMENTS) ×1 IMPLANT
DISSECT BALLN SPACEMKR + OVL (BALLOONS) ×2
DISSECTOR BALLN SPACEMKR + OVL (BALLOONS) IMPLANT
DRAPE LAPAROSCOPIC ABDOMINAL (DRAPES) ×2 IMPLANT
DRAPE UTILITY XL STRL (DRAPES) ×2 IMPLANT
DRSG TEGADERM 4X4.75 (GAUZE/BANDAGES/DRESSINGS) ×2 IMPLANT
ELECT REM PT RETURN 9FT ADLT (ELECTROSURGICAL) ×2
ELECTRODE REM PT RTRN 9FT ADLT (ELECTROSURGICAL) ×1 IMPLANT
GLOVE SURG SS PI 7.5 STRL IVOR (GLOVE) ×4 IMPLANT
GOWN STRL REIN XL XLG (GOWN DISPOSABLE) ×6 IMPLANT
KIT BASIN OR (CUSTOM PROCEDURE TRAY) ×2 IMPLANT
MESH ULTRAPRO 6X6 15CM15CM (Mesh General) ×1 IMPLANT
NS IRRIG 1000ML POUR BTL (IV SOLUTION) ×2 IMPLANT
PENCIL BUTTON HOLSTER BLD 10FT (ELECTRODE) ×2 IMPLANT
SCISSORS LAP 5X35 DISP (ENDOMECHANICALS) IMPLANT
SEALANT SURGICAL APPL DUAL CAN (MISCELLANEOUS) ×2 IMPLANT
SET IRRIG TUBING LAPAROSCOPIC (IRRIGATION / IRRIGATOR) IMPLANT
SOLUTION ANTI FOG 6CC (MISCELLANEOUS) ×2 IMPLANT
STAPLER VISISTAT 35W (STAPLE) IMPLANT
SUT MNCRL AB 4-0 PS2 18 (SUTURE) ×4 IMPLANT
TOWEL OR 17X26 10 PK STRL BLUE (TOWEL DISPOSABLE) ×2 IMPLANT
TOWEL OR NON WOVEN STRL DISP B (DISPOSABLE) ×2 IMPLANT
TRAY FOLEY CATH 14FRSI W/METER (CATHETERS) ×1 IMPLANT
TRAY FOLEY CATH 16FRSI W/METER (SET/KITS/TRAYS/PACK) ×1 IMPLANT
TRAY LAP CHOLE (CUSTOM PROCEDURE TRAY) ×2 IMPLANT
TROCAR BLADELESS OPT 5 75 (ENDOMECHANICALS) ×4 IMPLANT
TUBING INSUFFLATION 10FT LAP (TUBING) ×2 IMPLANT

## 2013-03-18 NOTE — Brief Op Note (Signed)
03/18/2013  11:17 AM  PATIENT:  Adam Pierce  37 y.o. male  PRE-OPERATIVE DIAGNOSIS:  left inguinal hernia   POST-OPERATIVE DIAGNOSIS:  left inguinal hernia   PROCEDURE:  Procedure(s): LAPAROSCOPIC INGUINAL HERNIA WITH MESH (Left) INSERTION OF MESH (Left)  SURGEON:  Surgeon(s) and Role:    * Lodema Pilot, DO - Primary  PHYSICIAN ASSISTANT:   ASSISTANTS: none   ANESTHESIA:   general  EBL:  Total I/O In: 2000 [I.V.:2000] Out: 80 [Urine:80]  BLOOD ADMINISTERED:none  DRAINS: none   LOCAL MEDICATIONS USED:  MARCAINE    and LIDOCAINE   SPECIMEN:  No Specimen  DISPOSITION OF SPECIMEN:  N/A  COUNTS:  YES  TOURNIQUET:  * No tourniquets in log *  DICTATION: .Other Dictation: Dictation Number [id number]  PLAN OF CARE: Discharge to home after PACU  PATIENT DISPOSITION:  PACU - hemodynamically stable.   Delay start of Pharmacological VTE agent (>24hrs) due to surgical blood loss or risk of bleeding: no

## 2013-03-18 NOTE — Interval H&P Note (Signed)
History and Physical Interval Note:  03/18/2013 9:21 AM  Adam Pierce  has presented today for surgery, with the diagnosis of left inguinal hernia repair with mesh  The various methods of treatment have been discussed with the patient and family. After consideration of risks, benefits and other options for treatment, the patient has consented to  Procedure(s): LAPAROSCOPIC INGUINAL HERNIA (Left) INSERTION OF MESH (N/A) as a surgical intervention .  The patient's history has been reviewed, patient examined, no change in status, stable for surgery.  I have reviewed the patient's chart and labs.  Questions were answered to the patient's satisfaction.  I have seen and evaluated the patient in the preop area.  Site marked with the patient (left).  Risks of the procedure discussed with him including infection, bleeding, pain, scarring, recurrence, nerve injury and chronic pain or persistent symptoms, bowel injury, injury to vas deferens or testicle and he expressed understanding and desires to proceed with lap left inguinal hernia repair with mesh.   Lodema Pilot DAVID

## 2013-03-18 NOTE — Preoperative (Signed)
Beta Blockers   Reason not to administer Beta Blockers:Not Applicable, not on home BB 

## 2013-03-18 NOTE — Anesthesia Postprocedure Evaluation (Signed)
  Anesthesia Post-op Note  Patient: Adam Pierce  Procedure(s) Performed: Procedure(s) (LRB): LAPAROSCOPIC INGUINAL HERNIA WITH MESH (Left) INSERTION OF MESH (Left)  Patient Location: PACU  Anesthesia Type: General  Level of Consciousness: awake and alert   Airway and Oxygen Therapy: Patient Spontanous Breathing  Post-op Pain: mild  Post-op Assessment: Post-op Vital signs reviewed, Patient's Cardiovascular Status Stable, Respiratory Function Stable, Patent Airway and No signs of Nausea or vomiting  Last Vitals:  Filed Vitals:   03/18/13 1312  BP: 136/66  Pulse: 62  Temp:   Resp: 16    Post-op Vital Signs: stable   Complications: No apparent anesthesia complications

## 2013-03-18 NOTE — Transfer of Care (Signed)
Immediate Anesthesia Transfer of Care Note  Patient: Adam Pierce  Procedure(s) Performed: Procedure(s): LAPAROSCOPIC INGUINAL HERNIA WITH MESH (Left) INSERTION OF MESH (Left)  Patient Location: PACU  Anesthesia Type:General  Level of Consciousness: Patient easily awoken, sedated, comfortable, cooperative, following commands, responds to stimulation.   Airway & Oxygen Therapy: Patient spontaneously breathing, ventilating well, oxygen via simple oxygen mask.  Post-op Assessment: Report given to PACU RN, vital signs reviewed and stable, moving all extremities.   Post vital signs: Reviewed and stable.  Complications: No apparent anesthesia complications

## 2013-03-18 NOTE — Anesthesia Preprocedure Evaluation (Signed)
Anesthesia Evaluation  Patient identified by MRN, date of birth, ID band Patient awake    Reviewed: Allergy & Precautions, H&P , NPO status , Patient's Chart, lab work & pertinent test results  Airway Mallampati: II TM Distance: >3 FB Neck ROM: Full    Dental no notable dental hx.    Pulmonary Current Smoker,  breath sounds clear to auscultation  Pulmonary exam normal       Cardiovascular negative cardio ROS  Rhythm:Regular Rate:Normal     Neuro/Psych negative neurological ROS  negative psych ROS   GI/Hepatic Neg liver ROS, GERD-  ,  Endo/Other  negative endocrine ROS  Renal/GU negative Renal ROS  negative genitourinary   Musculoskeletal negative musculoskeletal ROS (+)   Abdominal   Peds negative pediatric ROS (+)  Hematology negative hematology ROS (+)   Anesthesia Other Findings   Reproductive/Obstetrics negative OB ROS                           Anesthesia Physical Anesthesia Plan  ASA: II  Anesthesia Plan: General   Post-op Pain Management:    Induction: Intravenous  Airway Management Planned: Oral ETT  Additional Equipment:   Intra-op Plan:   Post-operative Plan: Extubation in OR  Informed Consent: I have reviewed the patients History and Physical, chart, labs and discussed the procedure including the risks, benefits and alternatives for the proposed anesthesia with the patient or authorized representative who has indicated his/her understanding and acceptance.   Dental advisory given  Plan Discussed with: CRNA  Anesthesia Plan Comments:         Anesthesia Quick Evaluation

## 2013-03-18 NOTE — H&P (View-Only) (Signed)
Patient ID: MUNG RINKER, male   DOB: 24-Jul-1975, 37 y.o.   MRN: [id number]  Chief Complaint  Patient presents with  . Inguinal Hernia    HPI Adam Pierce is a 37 y.o. male. This patient is known to me for prior evaluation of a left inguinal hernia. He first noticed this last October but his symptoms resolved with rest and he said he did not have the money at the time for surgery. He said recently he lifted up a heavy propane bottle and he began having recurrent pain similar to before in his left groin and has noticed the bulge again. He says this causes severe sharp pains in the left groin and he's been taking Naprosyn for relief which helps dull the pain.  He does do a lot of lifting for work and is interested in Psychologist, forensic as this does limit him.Marland Kitchen He does have a history of prior bilateral inguinal hernia repairs as a child HPI  Past Medical History  Diagnosis Date  . Inguinal hernia     Past Surgical History  Procedure Laterality Date  . Hernia repair  1978    6-8 mos     History reviewed. No pertinent family history.  Social History History  Substance Use Topics  . Smoking status: Current Every Day Smoker -- 1.00 packs/day for 15 years    Types: Cigarettes  . Smokeless tobacco: Never Used  . Alcohol Use: Yes     Comment: occassionally    No Known Allergies  Current Outpatient Prescriptions  Medication Sig Dispense Refill  . Aspirin-Salicylamide-Caffeine (BC HEADACHE POWDER PO) Take 1 packet by mouth daily as needed.      . naproxen (NAPROSYN) 250 MG tablet Take by mouth 2 (two) times daily with a meal.       No current facility-administered medications for this visit.    Review of Systems Review of Systems All other review of systems negative or noncontributory except as stated in the HPI  Blood pressure 120/76, pulse 72, resp. rate 16, height 6\' 2"  (1.88 m), weight 168 lb 3.2 oz (76.295 kg).  Physical Exam Physical Exam Physical Exam  Vitals  reviewed. Constitutional: He is oriented to person, place, and time. He appears well-developed and well-nourished. No distress.  HENT:  Head: Normocephalic and atraumatic.  Mouth/Throat: No oropharyngeal exudate.  Eyes: Conjunctivae and EOM are normal. Pupils are equal, round, and reactive to light. Right eye exhibits no discharge. Left eye exhibits no discharge. No scleral icterus.  Neck: Normal range of motion. No tracheal deviation present.  Cardiovascular: Normal rate, regular rhythm and normal heart sounds.   Pulmonary/Chest: Effort normal and breath sounds normal. No stridor. No respiratory distress. He has no wheezes. He has no rales. He exhibits no tenderness.  Abdominal: Soft. Bowel sounds are normal. He exhibits no distension and no mass. There is no tenderness. There is no rebound and no guarding. whss bilat groins.  No RIH.  Small and reducible LIH, tender Musculoskeletal: Normal range of motion. He exhibits no edema and no tenderness.  Neurological: He is alert and oriented to person, place, and time.  Skin: Skin is warm and dry. No rash noted. He is not diaphoretic. No erythema. No pallor.  Psychiatric: He has a normal mood and affect. His behavior is normal. Judgment and thought content normal.    Data Reviewed   Assessment    Recurrent left inguinal hernia  he appears to have a recurrent left inguinal hernia on exam. This is  a small bulge with Valsalva but it is tender. He has prior groin incisions consistent with prior hernia repairs as an infant and so I discussed with him the options for continued observation versus open and laparoscopic repair. I think that given his prior open repairs, I would recommend and plan for laparoscopic left inguinal hernia repair. We discussed the procedure and its risks. We discussed the risk of infection, bleeding, pain, scarring, persistent symptoms, nerve injury, need for open surgery, injury to the vas deferens or testicles, and recurrence  and he expressed understanding and desire to proceed with laparoscopic left inguinal hernia repair with mesh    Plan    We will schedule him for a laparoscopic left inguinal hernia repair with mesh as soon as available        Auburn Hert DAVID 03/11/2013, 11:38 AM

## 2013-03-19 ENCOUNTER — Encounter (HOSPITAL_COMMUNITY): Payer: Self-pay | Admitting: General Surgery

## 2013-03-19 ENCOUNTER — Telehealth (INDEPENDENT_AMBULATORY_CARE_PROVIDER_SITE_OTHER): Payer: Self-pay | Admitting: *Deleted

## 2013-03-19 NOTE — Op Note (Signed)
NAMEKIZER, NOBBE                ACCOUNT NO.:  [id number]  MEDICAL RECORD NO.:  [id number]  LOCATION:  [facility]                         FACILITY:  [facility]  PHYSICIAN:  Lodema Pilot, MD       DATE OF BIRTH:  Jan 04, 1976  DATE OF PROCEDURE:  03/18/2013 DATE OF DISCHARGE:  03/18/2013                              OPERATIVE REPORT   PROCEDURE:  Laparoscopic repair of recurrent left inguinal hernia.  PREOPERATIVE DIAGNOSIS:  Recurrent left inguinal hernia.  POSTOPERATIVE DIAGNOSIS:  Recurrent left inguinal hernia.  SURGEON:  Lodema Pilot, MD.  ASSISTANT:  None.  ANESTHESIA:  General endotracheal anesthesia with 40 mL of 1% lidocaine with epinephrine and 0.25% Marcaine in a 50:50 mixture.  FLUIDS:  1800 mL of crystalloid.  ESTIMATED BLOOD LOSS:  Minimal.  DRAINS:  None.  SPECIMENS:  None.  COMPLICATIONS:  None apparent.  FINDINGS:  Indirect inguinal hernia, and placement of 13 cm x 15 cm UltraPro mesh.  INDICATION FOR PROCEDURE:  Adam Pierce is a 37 year old male with history of bilateral open inguinal hernia repairs as an infant.  I saw him last year for a symptomatic left inguinal hernia and symptoms seemed to improve until recently when he had returned of his severe left inguinal pain and bulge.  He had an exam consistent with a recurrent left inguinal hernia.  DESCRIPTION OF PROCEDURE:  Adam Pierce was seen and evaluated in the preoperative area, and risks and benefits of procedure were again discussed in lay terms, and informed consent was obtained.  Surgical site was marked prior to anesthetic administration and he was given prophylactic antibiotics and taken to the operating room, placed on table in a supine position.  General endotracheal anesthesia was obtained.  Foley catheter was placed and his arms were tucked bilaterally.  His abdomen and scrotum and groin were prepped and draped in the standard surgical fashion.  Procedure time-out was performed with all  operative team members to confirm proper patient, procedure, and a semicircular infraumbilical incision was made in the skin and dissection carried down to the subcutaneous tissue using blunt dissection.  The abdominal fascia was sharply incised and the rectus muscle was retracted anterior and lateral, and Spacemaker balloon system was placed in the midline at the pubic tubercle and was insufflated under direct visualization.  He had a good separation in the preperitoneal space and the epigastric vessels were lifted up under direct visualization with the balloon inflated.  Two 5 mm trocars were placed in the midline taking care to avoid injury to the bladder.  Preperitoneal space was insufflated to 12 mmHg and began separating the preperitoneal space laterally at the anterior-superior iliac spine.  That worked medially towards the spermatic cord and hernia sac, which were seen.  He was noted to have an indirect inguinal hernia.  Pubic tubercle was identified.  Cooper's ligament was identified.  There was no direct inguinal hernia.  Because he was very thin, could actually see his epigastric vessels and his iliac vessels as well.  He does have a cord lipoma lateral to the cord, which was removed and reduced the hernia sac and separated from the spermatic cord.  His vas deferens and  artery and vein were actually fairly easily identified given the indirect hernia sac was taken down using blunt dissection.  After the hernia sac was taken down well posterior where the vas deferens turn immediately, I reinspected the area and again he did not have any evidence of direct inguinal hernia.  The indirect hernia sac was reduced and the preperitoneal space was noted hemostatic.  A 6-inch x 6-inch UltraPro mesh was trimmed to about 13 cm x 15 cm and placed in the preperitoneal space.  It was laid out over the cord and the defects and tacked medially to the anterior abdominal wall and then medially on  the medial aspect of the Cooper's ligament.  The mesh was laid out flat and covered the direct and indirect hernia defects and was tacked laterally to the anterior-superior iliac spine with a single tack.  I laid the mesh out flap posteriorly and certified that the hernia sac was posterior to the mesh and then glued the posterior aspect of the mesh in the preperitoneal space using Tisseel fibrin glue and the gas was removed and the space was let down under direct visualization to ensure the mesh laid flat in the preperitoneal space.  I then placed 20 mL of 1% lidocaine with epinephrine and 0.25% Marcaine in a 50:50 mixture in the preperitoneal space and removed the trocars.  The fascia at the umbilicus was approximated with interrupted 0 Vicryl sutures in open fashion and the wounds were irrigated with sterile saline solution.  The wounds were noted to be hemostatic and the skin edges were approximated with 4-0 Monocryl subcuticular suture.  Skin was washed and dried and Dermabond was applied.  All sponge, needle, and instrument counts correct in the case.  The patient tolerated the procedure well without apparent complications.          ______________________________ Lodema Pilot, MD     BL/MEDQ  D:  03/18/2013  T:  03/19/2013  Job:  [id number]

## 2013-03-19 NOTE — Telephone Encounter (Signed)
LMOM for pt to inform him of his post op appt with Dr. Biagio Quint on 04/09/13 with an arrival time of 9:00am.

## 2013-03-29 ENCOUNTER — Telehealth (INDEPENDENT_AMBULATORY_CARE_PROVIDER_SITE_OTHER): Payer: Self-pay

## 2013-03-29 NOTE — Telephone Encounter (Signed)
Pt calling to request a refill of his pain medication.  He was told a message would be sent to Dr. Biagio Quint and we would call him back tomorrow.  He knows someone will have to p/u Rx.  Also asked for a number to send FMLA paperwork.  He was given fax # for medical records.

## 2013-03-31 ENCOUNTER — Telehealth (INDEPENDENT_AMBULATORY_CARE_PROVIDER_SITE_OTHER): Payer: Self-pay | Admitting: General Surgery

## 2013-03-31 ENCOUNTER — Other Ambulatory Visit (INDEPENDENT_AMBULATORY_CARE_PROVIDER_SITE_OTHER): Payer: Self-pay | Admitting: *Deleted

## 2013-03-31 MED ORDER — HYDROCODONE-ACETAMINOPHEN 5-325 MG PO TABS: 1.0000 | ORAL_TABLET | ORAL | Status: DC | PRN

## 2013-03-31 NOTE — Telephone Encounter (Signed)
Pt called requesting pain meds refill.  Refill written Norco 5/325mg , dispense #30

## 2013-03-31 NOTE — Telephone Encounter (Signed)
Left a message for the patient that prescription is at the front desk ready for pickup.

## 2013-04-09 ENCOUNTER — Ambulatory Visit (INDEPENDENT_AMBULATORY_CARE_PROVIDER_SITE_OTHER): Payer: 59 | Admitting: General Surgery

## 2013-04-09 VITALS — BP 128/78 | HR 76 | Resp 16 | Ht 74.0 in | Wt 162.0 lb

## 2013-04-09 DIAGNOSIS — Z4889 Encounter for other specified surgical aftercare: Secondary | ICD-10-CM

## 2013-04-09 DIAGNOSIS — Z5189 Encounter for other specified aftercare: Secondary | ICD-10-CM

## 2013-04-09 NOTE — Progress Notes (Signed)
Subjective:     Patient ID: Adam Pierce, male   DOB: [birth year], 37 y.o.   MRN: [id number]  HPI This patient follows uptoday 3 weeks status post laparoscopic repair of left inguinal hernia. He says that he is improving daily but he still is taking his pain medication about once or twice per day. His bowels are functioning normally and he is tolerating regular diet. He has not noticed any bulge in that area.  Review of Systems     Objective:   Physical Exam His incisions are healing nicely without any sign of infection. I do not appreciate any recurrent hernia or bulge with Valsalva    Assessment:     Status post otoscopic left inguinal hernia repair with mesh-doing well He seems to be recovering nicely from his surgery. He still is taking the pain medication but it sounds as though he is coming off of this. I recommended that he continue with light-duty for another week and then he can gradually returned to normal activities as tolerated. I did provide a return to work notice for December 15 do think it would be appropriate for him to return to work at that time without any limitations     Plan:     Light-duty for another week and then gradually increase activity as tolerated. Return to work on December 15

## 2014-02-01 ENCOUNTER — Encounter (HOSPITAL_COMMUNITY): Payer: Self-pay | Admitting: Emergency Medicine

## 2014-02-01 ENCOUNTER — Emergency Department (HOSPITAL_COMMUNITY)
Admission: EM | Admit: 2014-02-01 | Discharge: 2014-02-01 | Discharge status: Against Medical Advice | Payer: 59 | Attending: Emergency Medicine | Admitting: Emergency Medicine

## 2014-02-01 DIAGNOSIS — F1420 Cocaine dependence, uncomplicated: Secondary | ICD-10-CM | POA: Insufficient documentation

## 2014-02-01 DIAGNOSIS — F17200 Nicotine dependence, unspecified, uncomplicated: Secondary | ICD-10-CM | POA: Insufficient documentation

## 2014-02-01 DIAGNOSIS — F10229 Alcohol dependence with intoxication, unspecified: Secondary | ICD-10-CM | POA: Insufficient documentation

## 2014-02-01 HISTORY — DX: Other psychoactive substance abuse, uncomplicated: F19.10

## 2014-02-01 LAB — CBC WITH DIFFERENTIAL/PLATELET
BASOS PCT: 0 % (ref 0–1)
Basophils Absolute: 0 10*3/uL (ref 0.0–0.1)
EOS ABS: 0.1 10*3/uL (ref 0.0–0.7)
Eosinophils Relative: 1 % (ref 0–5)
HEMATOCRIT: 51.1 % (ref 39.0–52.0)
HEMOGLOBIN: 17.5 g/dL — AB (ref 13.0–17.0)
LYMPHS ABS: 3.3 10*3/uL (ref 0.7–4.0)
Lymphocytes Relative: 33 % (ref 12–46)
MCH: 32.3 pg (ref 26.0–34.0)
MCHC: 34.2 g/dL (ref 30.0–36.0)
MCV: 94.3 fL (ref 78.0–100.0)
MONO ABS: 0.7 10*3/uL (ref 0.1–1.0)
MONOS PCT: 7 % (ref 3–12)
Neutro Abs: 5.8 10*3/uL (ref 1.7–7.7)
Neutrophils Relative %: 59 % (ref 43–77)
Platelets: 228 10*3/uL (ref 150–400)
RBC: 5.42 MIL/uL (ref 4.22–5.81)
RDW: 14.1 % (ref 11.5–15.5)
WBC: 9.8 10*3/uL (ref 4.0–10.5)

## 2014-02-01 LAB — COMPREHENSIVE METABOLIC PANEL
ALK PHOS: 66 U/L (ref 39–117)
ALT: 12 U/L (ref 0–53)
ANION GAP: 14 (ref 5–15)
AST: 16 U/L (ref 0–37)
Albumin: 4.1 g/dL (ref 3.5–5.2)
BILIRUBIN TOTAL: 0.6 mg/dL (ref 0.3–1.2)
BUN: 8 mg/dL (ref 6–23)
CO2: 25 mEq/L (ref 19–32)
Calcium: 9.2 mg/dL (ref 8.4–10.5)
Chloride: 105 mEq/L (ref 96–112)
Creatinine, Ser: 0.83 mg/dL (ref 0.50–1.35)
GFR calc non Af Amer: >90 mL/min (ref >90)
Glucose, Bld: 58 mg/dL — ABNORMAL LOW (ref 70–99)
POTASSIUM: 4.5 meq/L (ref 3.7–5.3)
Sodium: 144 mEq/L (ref 137–147)
TOTAL PROTEIN: 7.4 g/dL (ref 6.0–8.3)

## 2014-02-01 LAB — RAPID URINE DRUG SCREEN, HOSP PERFORMED
Amphetamines: NOT DETECTED
Barbiturates: NOT DETECTED
Benzodiazepines: NOT DETECTED
Cocaine: POSITIVE — AB
OPIATES: NOT DETECTED
Tetrahydrocannabinol: NOT DETECTED

## 2014-02-01 LAB — ETHANOL: Alcohol, Ethyl (B): 52 mg/dL — ABNORMAL HIGH (ref 0–11)

## 2014-02-01 NOTE — ED Notes (Signed)
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 Patient states he has made arrangements at [facility]. Patient states that he just needs medical clearance and fax results to [phone]. Patient  States he last drank 3 margaritas at 3 pm today. Patient states he last used cocaine last night. Patient denies any auditory or visual hallucinations. Patient denies SI/HI, but states he is very depressed and hard to deal with day to day activities.

## 2014-02-02 ENCOUNTER — Emergency Department (HOSPITAL_COMMUNITY)
Admission: EM | Admit: 2014-02-02 | Discharge: 2014-02-03 | Disposition: A | Discharge status: Other Acute Inpatient Hospital | Payer: 59 | Attending: Emergency Medicine | Admitting: Emergency Medicine

## 2014-02-02 ENCOUNTER — Encounter (HOSPITAL_COMMUNITY): Payer: Self-pay | Admitting: Emergency Medicine

## 2014-02-02 DIAGNOSIS — F1020 Alcohol dependence, uncomplicated: Secondary | ICD-10-CM

## 2014-02-02 DIAGNOSIS — F1920 Other psychoactive substance dependence, uncomplicated: Secondary | ICD-10-CM | POA: Insufficient documentation

## 2014-02-02 DIAGNOSIS — Z8719 Personal history of other diseases of the digestive system: Secondary | ICD-10-CM | POA: Insufficient documentation

## 2014-02-02 DIAGNOSIS — F11220 Opioid dependence with intoxication, uncomplicated: Secondary | ICD-10-CM

## 2014-02-02 DIAGNOSIS — F10229 Alcohol dependence with intoxication, unspecified: Secondary | ICD-10-CM | POA: Insufficient documentation

## 2014-02-02 DIAGNOSIS — G479 Sleep disorder, unspecified: Secondary | ICD-10-CM | POA: Insufficient documentation

## 2014-02-02 DIAGNOSIS — R630 Anorexia: Secondary | ICD-10-CM | POA: Insufficient documentation

## 2014-02-02 DIAGNOSIS — Z791 Long term (current) use of non-steroidal anti-inflammatories (NSAID): Secondary | ICD-10-CM | POA: Insufficient documentation

## 2014-02-02 DIAGNOSIS — F329 Major depressive disorder, single episode, unspecified: Secondary | ICD-10-CM | POA: Insufficient documentation

## 2014-02-02 DIAGNOSIS — F17210 Nicotine dependence, cigarettes, uncomplicated: Secondary | ICD-10-CM | POA: Insufficient documentation

## 2014-02-02 HISTORY — DX: Depression, unspecified: F32.A

## 2014-02-02 HISTORY — DX: Major depressive disorder, single episode, unspecified: F32.9

## 2014-02-02 LAB — COMPREHENSIVE METABOLIC PANEL
ALT: 14 U/L (ref 0–53)
ANION GAP: 13 (ref 5–15)
AST: 19 U/L (ref 0–37)
Albumin: 4.3 g/dL (ref 3.5–5.2)
Alkaline Phosphatase: 69 U/L (ref 39–117)
BUN: 7 mg/dL (ref 6–23)
CO2: 27 meq/L (ref 19–32)
CREATININE: 0.75 mg/dL (ref 0.50–1.35)
Calcium: 9.7 mg/dL (ref 8.4–10.5)
Chloride: 100 mEq/L (ref 96–112)
GFR calc Af Amer: >90 mL/min (ref >90)
Glucose, Bld: 79 mg/dL (ref 70–99)
Potassium: 4.1 mEq/L (ref 3.7–5.3)
Sodium: 140 mEq/L (ref 137–147)
Total Bilirubin: 1 mg/dL (ref 0.3–1.2)
Total Protein: 7.5 g/dL (ref 6.0–8.3)

## 2014-02-02 LAB — CBC WITH DIFFERENTIAL/PLATELET
Basophils Absolute: 0 10*3/uL (ref 0.0–0.1)
Basophils Relative: 0 % (ref 0–1)
EOS PCT: 1 % (ref 0–5)
Eosinophils Absolute: 0 10*3/uL (ref 0.0–0.7)
HEMATOCRIT: 51.7 % (ref 39.0–52.0)
HEMOGLOBIN: 17.6 g/dL — AB (ref 13.0–17.0)
LYMPHS PCT: 34 % (ref 12–46)
Lymphs Abs: 2.7 10*3/uL (ref 0.7–4.0)
MCH: 31.9 pg (ref 26.0–34.0)
MCHC: 34 g/dL (ref 30.0–36.0)
MCV: 93.7 fL (ref 78.0–100.0)
MONO ABS: 0.8 10*3/uL (ref 0.1–1.0)
Monocytes Relative: 10 % (ref 3–12)
NEUTROS ABS: 4.4 10*3/uL (ref 1.7–7.7)
Neutrophils Relative %: 55 % (ref 43–77)
Platelets: 218 10*3/uL (ref 150–400)
RBC: 5.52 MIL/uL (ref 4.22–5.81)
RDW: 13.7 % (ref 11.5–15.5)
WBC: 8 10*3/uL (ref 4.0–10.5)

## 2014-02-02 LAB — RAPID URINE DRUG SCREEN, HOSP PERFORMED
Amphetamines: NOT DETECTED
Barbiturates: NOT DETECTED
Benzodiazepines: NOT DETECTED
Cocaine: POSITIVE — AB
OPIATES: NOT DETECTED
TETRAHYDROCANNABINOL: NOT DETECTED

## 2014-02-02 LAB — ETHANOL: Alcohol, Ethyl (B): <11 mg/dL (ref 0–11)

## 2014-02-02 MED ORDER — LORAZEPAM 1 MG PO TABS
1.0000 mg | ORAL_TABLET | Freq: Three times a day (TID) | ORAL | Status: DC | PRN
  Administered 2014-02-02: 1 mg via ORAL
  Filled 2014-02-02: qty 1

## 2014-02-02 MED ORDER — ACETAMINOPHEN 325 MG PO TABS
650.0000 mg | ORAL_TABLET | ORAL | Status: DC | PRN
  Administered 2014-02-02: 650 mg via ORAL
  Filled 2014-02-02: qty 2

## 2014-02-02 MED ORDER — IBUPROFEN 200 MG PO TABS: 600.0000 mg | ORAL_TABLET | Freq: Three times a day (TID) | ORAL | Status: DC | PRN

## 2014-02-02 MED ORDER — ONDANSETRON HCL 4 MG PO TABS
4.0000 mg | ORAL_TABLET | Freq: Three times a day (TID) | ORAL | Status: DC | PRN
  Administered 2014-02-02: 4 mg via ORAL
  Filled 2014-02-02: qty 1

## 2014-02-02 MED ORDER — NICOTINE 21 MG/24HR TD PT24
21.0000 mg | MEDICATED_PATCH | Freq: Every day | TRANSDERMAL | Status: DC
  Administered 2014-02-02 – 2014-02-03 (×2): 21 mg via TRANSDERMAL
  Filled 2014-02-02 (×2): qty 1

## 2014-02-02 MED ORDER — ALUM & MAG HYDROXIDE-SIMETH 200-200-20 MG/5ML PO SUSP
30.0000 mL | ORAL | Status: DC | PRN
  Administered 2014-02-03: 30 mL via ORAL
  Filled 2014-02-02: qty 30

## 2014-02-02 MED ORDER — ZOLPIDEM TARTRATE 5 MG PO TABS: 5.0000 mg | ORAL_TABLET | Freq: Every evening | ORAL | Status: DC | PRN

## 2014-02-02 NOTE — ED Notes (Signed)
Patient daughter had called and said she wanted to give a list of people who can not be around patient. Writer explained the patient is over the age of 38 and that will have to be his choice about his visitors. Patient daughter also asked for information about her dad care, writer explained because of the [place] laws and he is considered a adult I will have to ask him to sign a consent giving the staff permission to release any information. I explained she could call back in an hour she said ok. Writer wen tin and explained to patient about his daughter requests patient said no he will call her tomorrow he does not want her to know certain information and that he will make the choices about his care and who he can see. RN Leslie Andreaashell and Aundra MilletMegan was notified of the situation.

## 2014-02-02 NOTE — ED Provider Notes (Signed)
Medical screening examination/treatment/procedure(s) were performed by non-physician practitioner and as supervising physician I was immediately available for consultation/collaboration.  Trudee Chirino T John Williamsen, MD 02/02/14 2314 

## 2014-02-02 NOTE — ED Provider Notes (Signed)
CSN: [id number]     Arrival date & time 02/02/14  1711 History  This chart was scribed for Fayrene HelperBowie Chukwuma Straus, PA with Toy BakerAnthony T Allen, MD by Tonye RoyaltyJoshua Chen, ED Scribe. This patient was seen in room [facility]TR3/[facility]PT3 and the patient's care was started at 5:46 PM.    Chief Complaint  Patient presents with  . Detox   . ETOH, cocaine    The history is provided by the patient. No language interpreter was used.   HPI Comments: Adam Pierce is a 38 y.o. male who presents to the Emergency Department requesting help for detox from alcohol and cocaine. He states he has been using alcohol and drugs since he was 38 years old. He states he has hit "rock bottom" and has lost everything. He states he has not sought help before. He denies using heroin and denies recently using meth. He states that he has used as well as sold cocaine. He reports talking to someone at the federal crisis line who has helped him. He states he last used cocaine 2 days ago and last used alcohol yesterday. He reports having vivid dreams, sleep disturbance, and depression. He states he has to force himself to eat. He denies suicidal ideation, homicidal ideation, or hallucinations.  Past Medical History  Diagnosis Date  . Inguinal hernia   . GERD (gastroesophageal reflux disease)   . Substance abuse    Past Surgical History  Procedure Laterality Date  . Hernia repair Bilateral 1978    6-8 mos old  . Inguinal hernia repair Left 03/18/2013    Procedure: LAPAROSCOPIC INGUINAL HERNIA WITH MESH;  Surgeon: Lodema PilotBrian Layton, DO;  [facility]: [facility];  Service: General;  Laterality: Left;  . Insertion of mesh Left 03/18/2013    Procedure: INSERTION OF MESH;  Surgeon: Lodema PilotBrian Layton, DO;  Location: [facility];  Service: General;  Laterality: Left;   No family history on file. History  Substance Use Topics  . Smoking status: Current Every Day Smoker -- 1.00 packs/day for 15 years    Types: Cigarettes  . Smokeless tobacco: Never Used  . Alcohol Use: Yes   Comment: daily    Review of Systems  Constitutional: Positive for appetite change.  Psychiatric/Behavioral: Positive for sleep disturbance. Negative for suicidal ideas and hallucinations.       Denies HI, reports depression      Allergies  Review of patient's allergies indicates no known allergies.  Home Medications   Prior to Admission medications   Medication Sig Start Date End Date Taking? Authorizing Provider  HYDROcodone-acetaminophen (NORCO) 5-325 MG per tablet Take 1 tablet by mouth every 4 (four) hours as needed for moderate pain. 03/31/13   Romie LeveeAlicia Thomas, MD  naproxen sodium (ANAPROX) 220 MG tablet Take 440 mg by mouth 2 (two) times daily with a meal.    Historical Provider, MD   BP 154/82  Pulse 74  Temp(Src) 98.5 F (36.9 C) (Oral)  Resp 18  SpO2 100% Physical Exam  Nursing note and vitals reviewed. Constitutional: He is oriented to person, place, and time. He appears well-developed and well-nourished.  HENT:  Head: Normocephalic and atraumatic.  Eyes: Conjunctivae are normal.  Neck: Normal range of motion. Neck supple.  Cardiovascular: Normal rate, regular rhythm and normal heart sounds.   No murmur heard. Pulmonary/Chest: Effort normal and breath sounds normal. No respiratory distress. He has no wheezes. He has no rales.  Musculoskeletal: Normal range of motion.  Neurological: He is alert and oriented to person, place, and time.  Skin: Skin is warm and dry.  Psychiatric: He has a normal mood and affect.    ED Course  Procedures (including critical care time) Labs Review Labs Reviewed  CBC WITH DIFFERENTIAL - Abnormal; Notable for the following:    Hemoglobin 17.6 (*)    All other components within normal limits  URINE RAPID DRUG SCREEN (HOSP PERFORMED) - Abnormal; Notable for the following:    Cocaine POSITIVE (*)    All other components within normal limits  COMPREHENSIVE METABOLIC PANEL  ETHANOL    Imaging Review No results found.   EKG  Interpretation None     DIAGNOSTIC STUDIES: Oxygen Saturation is 100% on room air, normal by my interpretation.    COORDINATION OF CARE: 5:52 PM I will provide him resources to contact for treatment including possible inpatient treatment. I do not believe he is in a condition that requires inpatient treatment here. I will order some blood work to make sure his electrolytes are okay.  7:41 PM Pt is medically cleared.  He can f/u outpt for further treatment including detox.  Pt given resources however pt request to be seen by [facility] and to have his medical clearance paper faxed to [facility] in order to go for detox.  Will consult [facility] for further care.    MDM   Final diagnoses:  Polysubstance dependence including opioid type drug, episodic abuse, uncomplicated    BP 154/82  Pulse 74  Temp(Src) 98.5 F (36.9 C) (Oral)  Resp 18  SpO2 100%   I personally performed the services described in this documentation, which was scribed in my presence. The recorded information has been reviewed and is accurate.     Fayrene Helper, PA-C 02/02/14 1942  Fayrene Helper, PA-C 02/02/14 [phone]

## 2014-02-02 NOTE — ED Notes (Addendum)
Pt states that he was here yesterday for etoh and cocaine detox.  Pt states that he was here yesterday for detox but left because "he had loose ends to tie up".  Pt states that he had a bed yesterday at [facility] but is unsure if that is still the case.  Pt requesting medical clearance.  Last drink was yesterday after he left.  Drank 2 40s.  Pt states that he last used cocaine the night before last.  Ambulatory.  Denies SI/HI.

## 2014-02-02 NOTE — ED Notes (Signed)
Pt given dinner tray.

## 2014-02-02 NOTE — ED Notes (Signed)
Patient ambulatory to room 43 via ambulatory with a steady gait escorted by pct. No acute distress noted.

## 2014-02-02 NOTE — Discharge Instructions (Signed)
Please use resources below to find treatment for your drug abuse.     Finding Treatment for Alcohol and Drug Addiction It can be hard to find the right place to get professional treatment. Here are some important things to consider:  There are different types of treatment to choose from.  Some programs are live-in (residential) while others are not (outpatient). Sometimes a combination is offered.  No single type of program is right for everyone.  Most treatment programs involve a combination of education, counseling, and a 12-step, spiritually-based approach.  There are non-spiritually based programs (not 12-step).  Some treatment programs are government sponsored. They are geared for patients without private insurance.  Treatment programs can vary in many respects such as:  Cost and types of insurance accepted.  Types of on-site medical services offered.  Length of stay, setting, and size.  Overall philosophy of treatment. A person may need specialized treatment or have needs not addressed by all programs. For example, adolescents need treatment appropriate for their age. Other people have secondary disorders that must be managed as well. Secondary conditions can include mental illness, such as depression or diabetes. Often, a period of detoxification from alcohol or drugs is needed. This requires medical supervision and not all programs offer this. THINGS TO CONSIDER WHEN SELECTING A TREATMENT PROGRAM   Is the program certified by the appropriate government agency? Even private programs must be certified and employ certified professionals.  Does the program accept your insurance? If not, can a payment plan be set up?  Is the facility clean, organized, and well run? Do they allow you to speak with graduates who can share their treatment experience with you? Can you tour the facility? Can you meet with staff?  Does the program meet the full range of individual needs?  Does the  treatment program address sexual orientation and physical disabilities? Do they provide age, gender, and culturally appropriate treatment services?  Is treatment available in languages other than English?  Is long-term aftercare support or guidance encouraged and provided?  Is assessment of an individual's treatment plan ongoing to ensure it meets changing needs?  Does the program use strategies to encourage reluctant patients to remain in treatment long enough to increase the likelihood of success?  Does the program offer counseling (individual or group) and other behavioral therapies?  Does the program offer medicine as part of the treatment regimen, if needed?  Is there ongoing monitoring of possible relapse? Is there a defined relapse prevention program? Are services or referrals offered to family members to ensure they understand addiction and the recovery process? This would help them support the recovering individual.  Are 12-step meetings held at the center or is transport available for patients to attend outside meetings? In countries outside of the [place] and [place], Magazine features editorsee local directories for contact information for services in your area. Document Released: 03/21/2005 Document Revised: 07/15/2011 Document Reviewed: 10/01/2007 [facility] Patient Information 2015 [place], [place]. This information is not intended to replace advice given to you by your health care provider. Make sure you discuss any questions you have with your health care provider.   Emergency Department Resource Guide 1) Find a Doctor and Pay Out of Pocket Although you won't have to find out who is covered by your insurance plan, it is a good idea to ask around and get recommendations. You will then need to call the office and see if the doctor you have chosen will accept you as a new patient and what  types of options they offer for patients who are self-pay. Some doctors offer discounts or will set up payment plans for  their patients who do not have insurance, but you will need to ask so you aren't surprised when you get to your appointment.  2) Contact Your Local Health Department Not all health departments have doctors that can see patients for sick visits, but many do, so it is worth a call to see if yours does. If you don't know where your local health department is, you can check in your phone book. The CDC also has a tool to help you locate your state's health department, and many state websites also have listings of all of their local health departments.  3) Find a Walk-in Clinic If your illness is not likely to be very severe or complicated, you may want to try a walk in clinic. These are popping up all over the country in pharmacies, drugstores, and shopping centers. They're usually staffed by nurse practitioners or physician assistants that have been trained to treat common illnesses and complaints. They're usually fairly quick and inexpensive. However, if you have serious medical issues or chronic medical problems, these are probably not your best option.  No Primary Care Doctor: - Call Health Connect at  [phone] - they can help you locate a primary care doctor that  accepts your insurance, provides certain services, etc. - Physician Referral Service- [phone]  Chronic Pain Problems: Organization         Address  Phone   Notes  [facility]  [phone] Patients need to be referred by their primary care doctor.   Medication Assistance: Organization         Address  Phone   Notes  [facility] Medication [facility] [address] [place], [place] [address] [phone] --Must be a resident of [facility] -- Must have NO insurance coverage whatsoever (no Medicaid/ Medicare, etc.) -- The pt. MUST have a primary care doctor that directs their care regularly and follows them in the community   [facility]ist  [phone]   [employer]  [phone]    Agencies that provide inexpensive medical care: Organization         Address  Phone   Notes  [facility]  [phone]   [facility]    [phone]   [facility] [address] [place], [place] [address] [phone]   [facility] [address], [place] [phone]   Planned Parenthood    [phone]   [facility]    [phone]   [facility]  [address], [place] Phone:  [phone], Fax:  [phone] Hours of Operation:  9 am - 6 pm, M-F.  Also accepts Medicaid/Medicare and self-pay.  [facility]  [address], [place] Phone: [phone], Fax: [phone]. Hours of Operation:  8:30 am - 5:30 pm, M-F.  Also accepts Medicaid and self-pay.  [facility] [place] [address], [place] Phone: [phone]   [facility] [address] Natasha Bence [place], [place] [phone], Ext. [phone] Mondays & Thursdays: 7-9 AM.  First 15 patients are seen on a first come, first serve basis.    Medicaid-accepting [facility] Providers:  Retail buyer  Notes  [facility] [address], [place] [phone] Also accepts self-pay patients.  [facility] [address] Laurell Josephs [place], [place]  [phone]   [facility] [address], [place] [phone]   [facility] [address], [place] [phone]   Renaye Rakers [address], [place]   [phone] Only accepts [place] Access [place] patients after they have their name applied to their card.   Self-Pay (no insurance) in [facility]:  Organization         Address  Phone   Notes  Sickle Cell Patients, [facility] [address] [place], [place] [phone]   [facility] Urgent Care [address] [place], [place] [phone]   [facility]  [address], [place] [phone]   [facility]/Dr. Osei-Bonsu  [address] [place] or [address], [place] [phone] Phone number for both [place] and [place] locations is the same.  [facility]gent Medical and [facility] [address], [place] [phone]   [facility] [address] [place] or [address] [phone]   [facility] [address], [place] [phone], phone; [phone], fax Sees patients 1st and 3rd Saturday of every month.  Must not qualify for public or private insurance (i.e. Medicaid, Medicare, [place] Health Choice, Veterans' Benefits)  Household income should be no more than 200% of the poverty level The clinic cannot treat you if you are pregnant or think you are pregnant  Sexually transmitted diseases are not treated at the clinic.    Dental Care: Organization         Address  Phone  Notes  [facility] Department of [facility] [address] [place], [place] [phone] Accepts children up to age 24 who are enrolled in [place] or [place] Health Choice; pregnant women with a Medicaid card; and children who have applied for Medicaid or [place] Health Choice, but were declined, whose parents can pay a reduced fee at time of service.  [facility] Department of [facility]  [address], [place] [phone] Accepts children up to age 32 who are enrolled in [place] or [place] Health Choice; pregnant women with a Medicaid card; and children who have applied for Medicaid or [place] Health Choice, but were declined, whose parents can pay a reduced fee at time of service.  [facility]  [address] [place], [place] [phone] Patients are  seen by appointment only. Walk-ins are not accepted. [facility] will see patients 29 years of age and older. Monday - Tuesday (8am-5pm) Most Wednesdays (8:30-5pm) $30 per visit, cash only  [facility]  [address], [facility] [phone] Patients are seen by appointment only. Walk-ins are not accepted. [facility] will see patients 64 years of age and older. One Wednesday Evening (Monthly: Volunteer Based).  $30 per visit, cash only  [employer]  [phone] for adults; Children under age 34, call Graduate Pediatric Dentistry at [phone]. Children aged 27-14, please call [phone] to request a pediatric application.  Dental services are provided in all areas of dental care including fillings, crowns and bridges, complete  and partial dentures, implants, gum treatment, root canals, and extractions. Preventive care is also provided. Treatment is provided to both adults and children. Patients are selected via a lottery and there is often a waiting list.   [facility] [address] [place]  [phone] [email or web address]   [facility] [address] [place], [place] [phone], Ext. [phone] Second and Fourth Thursday of each month, opens at 6:30 AM; Clinic ends at 9 AM.  Patients are seen on a first-come first-served basis, and a limited number are seen during each clinic.   [facility]  [address] Ether Griffins [place], [place] [phone]   Eligibility Requirements You must have lived in [place], [place], or [place] counties for at least the last three months.   You cannot be eligible for state or federal sponsored [place], including [employer], [place], or [employer].   You generally cannot be eligible for healthcare insurance through your employer.    How to apply: Eligibility screenings are held every Tuesday and Wednesday afternoon from 1:00 pm until 4:00  pm. You do not need an appointment for the interview!  [facility] [address] [place], [place] [phone]   [facility] Health Department  [phone]   [facility] Health Department  [phone]   [facility] Health Department  [phone]    Behavioral Health Resources in the Community: Intensive Outpatient Programs Organization         Address  Phone  Notes  [facility] [address], [place], [place] [phone]   [facility] Outpatient [address] [place], [place] [phone]   [facility] [address] [place], [place]  [phone]   [facility] Mental Health [address]  [place], [place] [phone] or [phone]   Substance Abuse Resources Organization         Address  Phone  Notes  [facility] Drug Services  [phone]   [facility]  [phone]   [facility]  [phone]   [place]  [phone]   Residential & Outpatient Substance Abuse Program  [phone]   Psychological Services Organization         Address  Phone  Notes  [facility]  [phone]   [facility] Services  [phone]   [facility] Mental Health [address] [place] [phone] or [phone]    Mobile Crisis Teams Organization         Address  Phone  Notes  [facility], Mobile Crisis Care Unit  [phone]   [facility]  [address] [place], [place] [phone]   Doristine Locks [address] [place] [phone]    Self-Help/Support Groups Organization         Address  Phone             Notes  [facility] [place] - variety of support groups  336- I7437963 Call for more information  Narcotics Anonymous (NA), [facility] [address], [employer]   2 meetings at this location   Statistician          Address  Phone  Notes  [facility] [address],    [place]  [phone]   [facility]  [address], [place] [address], [place], [place] [phone]   [facility] [address] [place], [place] [phone] Admissions: 8am-3pm  M-F  [facility] [address],    [place], [place] [phone]   [facility] [address] [place], [place], [place] [phone]   [facility] [address]  [place], [place] [phone]   [facility] - Intensive Outpatient [address]ure[address], [place], [place] [phone]   [facility] ([facility]iction [facility] [address] [place].,  [place], [place] [phone] or [phone]   [facility] ([facility]) [address] [place], [place] [phone] Accepts Medicaid  [facility] [address]  [place] [phone] Substance Abuse/Addiction Treatment   [facility] Organization         Address  Phone  Notes  [facility]  [phone]   Angie Fava, PhD [address] Ervin Knack [place], [place]   [phone] or [phone]   [facility]   [address] [place], [place] [phone]   [facility] [address], [place], [place] [phone] Insurance/Medicaid/sponsorship through [facility] and Families [address]                                    [place], [place] [phone] Therapy/tele-psych/case  [facility] [address] [place], [place] [phone]    Dr. Lolly Mustache  [phone]   [facility]  [employer] [facility] Dept. 1) [address], [place] 2) [address], [place] 3)  [address], [place] [phone]  [phone]   [facility] Child Abuse Hotline 586-406-5394 or [phone] (After Hours)

## 2014-02-03 ENCOUNTER — Encounter (HOSPITAL_COMMUNITY): Payer: Self-pay | Admitting: *Deleted

## 2014-02-03 ENCOUNTER — Observation Stay (HOSPITAL_COMMUNITY)
Admission: AD | Admit: 2014-02-03 | Discharge: 2014-02-06 | Disposition: A | Discharge status: Home / Self Care | Payer: 59 | Source: Intra-hospital | Attending: Psychiatry | Admitting: Psychiatry

## 2014-02-03 DIAGNOSIS — F10230 Alcohol dependence with withdrawal, uncomplicated: Secondary | ICD-10-CM

## 2014-02-03 DIAGNOSIS — F1410 Cocaine abuse, uncomplicated: Secondary | ICD-10-CM | POA: Insufficient documentation

## 2014-02-03 DIAGNOSIS — F1020 Alcohol dependence, uncomplicated: Principal | ICD-10-CM | POA: Insufficient documentation

## 2014-02-03 MED ORDER — ONDANSETRON HCL 4 MG PO TABS: 4.0000 mg | ORAL_TABLET | Freq: Three times a day (TID) | ORAL | Status: DC | PRN

## 2014-02-03 MED ORDER — CHLORDIAZEPOXIDE HCL 25 MG PO CAPS
25.0000 mg | ORAL_CAPSULE | Freq: Four times a day (QID) | ORAL | Status: DC | PRN
  Filled 2014-02-03: qty 1

## 2014-02-03 MED ORDER — ADULT MULTIVITAMIN W/MINERALS CH
1.0000 | ORAL_TABLET | Freq: Every day | ORAL | Status: DC
  Administered 2014-02-03 – 2014-02-06 (×4): 1 via ORAL
  Filled 2014-02-03 (×7): qty 1

## 2014-02-03 MED ORDER — CHLORDIAZEPOXIDE HCL 25 MG PO CAPS
25.0000 mg | ORAL_CAPSULE | Freq: Three times a day (TID) | ORAL | Status: AC
  Administered 2014-02-04 (×3): 25 mg via ORAL
  Filled 2014-02-03 (×3): qty 1

## 2014-02-03 MED ORDER — VITAMIN B-1 100 MG PO TABS
100.0000 mg | ORAL_TABLET | Freq: Every day | ORAL | Status: DC
  Administered 2014-02-04 – 2014-02-06 (×3): 100 mg via ORAL
  Filled 2014-02-03 (×5): qty 1

## 2014-02-03 MED ORDER — THIAMINE HCL 100 MG/ML IJ SOLN
100.0000 mg | Freq: Once | INTRAMUSCULAR | Status: AC
  Administered 2014-02-03: 100 mg via INTRAMUSCULAR
  Filled 2014-02-03: qty 2

## 2014-02-03 MED ORDER — CHLORDIAZEPOXIDE HCL 25 MG PO CAPS
25.0000 mg | ORAL_CAPSULE | ORAL | Status: AC
  Administered 2014-02-05: 25 mg via ORAL
  Filled 2014-02-03 (×2): qty 1

## 2014-02-03 MED ORDER — LOPERAMIDE HCL 2 MG PO CAPS: 2.0000 mg | ORAL_CAPSULE | ORAL | Status: DC | PRN

## 2014-02-03 MED ORDER — ALUM & MAG HYDROXIDE-SIMETH 200-200-20 MG/5ML PO SUSP
30.0000 mL | ORAL | Status: DC | PRN
Start: 2014-02-03 — End: 2014-02-06

## 2014-02-03 MED ORDER — CHLORDIAZEPOXIDE HCL 25 MG PO CAPS
25.0000 mg | ORAL_CAPSULE | Freq: Four times a day (QID) | ORAL | Status: AC
  Administered 2014-02-03 (×2): 25 mg via ORAL
  Filled 2014-02-03 (×2): qty 1

## 2014-02-03 MED ORDER — ACETAMINOPHEN 325 MG PO TABS: 650.0000 mg | ORAL_TABLET | Freq: Four times a day (QID) | ORAL | Status: DC | PRN

## 2014-02-03 MED ORDER — CHLORDIAZEPOXIDE HCL 25 MG PO CAPS: 25.0000 mg | ORAL_CAPSULE | Freq: Every day | ORAL | Status: DC

## 2014-02-03 MED ORDER — HYDROXYZINE HCL 25 MG PO TABS
25.0000 mg | ORAL_TABLET | Freq: Four times a day (QID) | ORAL | Status: DC | PRN
  Administered 2014-02-04: 25 mg via ORAL
  Filled 2014-02-03: qty 1

## 2014-02-03 MED ORDER — ZOLPIDEM TARTRATE 5 MG PO TABS
5.0000 mg | ORAL_TABLET | Freq: Every evening | ORAL | Status: DC | PRN
  Administered 2014-02-03 – 2014-02-05 (×3): 5 mg via ORAL
  Filled 2014-02-03 (×3): qty 1

## 2014-02-03 MED ORDER — NICOTINE 21 MG/24HR TD PT24
21.0000 mg | MEDICATED_PATCH | Freq: Every day | TRANSDERMAL | Status: DC
  Administered 2014-02-04 – 2014-02-06 (×3): 21 mg via TRANSDERMAL
  Filled 2014-02-03 (×5): qty 1

## 2014-02-03 MED ORDER — ACETAMINOPHEN 325 MG PO TABS
650.0000 mg | ORAL_TABLET | ORAL | Status: DC | PRN
  Filled 2014-02-03: qty 2

## 2014-02-03 MED ORDER — ALUM & MAG HYDROXIDE-SIMETH 200-200-20 MG/5ML PO SUSP: 30.0000 mL | ORAL | Status: DC | PRN

## 2014-02-03 MED ORDER — MAGNESIUM HYDROXIDE 400 MG/5ML PO SUSP
30.0000 mL | Freq: Every day | ORAL | Status: DC | PRN
  Administered 2014-02-05: 30 mL via ORAL

## 2014-02-03 MED ORDER — ONDANSETRON 4 MG PO TBDP: 4.0000 mg | ORAL_TABLET | Freq: Four times a day (QID) | ORAL | Status: DC | PRN

## 2014-02-03 NOTE — Progress Notes (Signed)
Pt alert, oriented and cooperative. -SI/HI, -A/vhall , verbally contacts for safety. Denies pain or discomfort at present. C/o making mistakes and being depressed. Wants long term treatment.  Interactive with peers and staff.  Will continue to monitor closely and evaluate for stabilization.

## 2014-02-03 NOTE — Consult Note (Signed)
  [facility] treatment of alcohol detox.  He has scheduled an interview call with [facility] tomorrow for rehab.  [facility]

## 2014-02-03 NOTE — Progress Notes (Signed)
[facility] INPATIENT:  Family/Significant Other Suicide Prevention Education  Suicide Prevention Education:  Patient Refusal for Family/Significant Other Suicide Prevention Education: The patient Adam Pierce has refused to provide written consent for family/significant other to be provided Family/Significant Other Suicide Prevention Education during admission and/or prior to discharge.  Physician notified.  Adam Pierce, Adam Pierce 02/03/2014, 5:37 PM

## 2014-02-03 NOTE — [facility] Counselor (Addendum)
Pt states he has a phone appointment with [facility] on 10/9.   Support [facility]. [facility] placed in pt's chart.  Evette Cristalaroline Paige Elia Nunley, [place] Assessment Counselor
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
## 2014-02-03 NOTE — Plan of Care (Signed)
[facility] Observation Crisis Plan  Reason for Crisis Plan:  Substance Abuse   Plan of Care:  Referral for Substance Abuse  Family Support:      Current Living Environment:  Living Arrangements: Alone  Insurance:   Hospital Account   Name Acct ID Class Status Primary Coverage   Adam Pierce, Faisal Pierce [id number] BEHAVIORAL HEALTH OBSERVATION Open None        Guarantor Account (for Hospital Account [id number])   Name Relation to Pt Service Area Active? Acct Type   Adam Pierce, Adam Pierce Self [facility] Yes Behavioral Health   Address Phone       [address] [place], [name] [id number] [phone](H)          Coverage Information (for Hospital Account [id number])   Not on file      Legal Guardian:     Primary Care Provider:  No PCP Per Patient  Current Outpatient Providers:  none  Psychiatrist:     Counselor/Therapist:     Compliant with Medications:  Yes  Additional Information:   Adam Pierce, Adam Pierce 10/1/20152:09 PM
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
## 2014-02-03 NOTE — Progress Notes (Signed)
Patient ID: Debbra RidingMark L Pierce, male   DOB: 12/17/1975, 38 y.o.   MRN: [id number] Slept after settling in to unit and on awakening, states feels better. Ate a good dinner and made several calls to various family members. Verbal and cooperative. He has no complaints of withdrawal sx. Late getting Librium due to being asleep. Spoke with Sanjuan Dameom H. Re his desire to have long term treatment directly from here.

## 2014-02-03 NOTE — BH Assessment (Signed)
Tele Assessment Note   Adam Pierce is a 38 y.o. male who presents to [facility] for alcohol and cocaine detox. Pt denies SI/HI/AVH.  Pt reports  an increase in alcohol /cocaine consumption for the last 5 days due to a recent break up with his girlfriend.  Pt states he drinks 12-18pk of beer, daily.  Pt says his last drink was on 01/2914, he drank  2-40's and 3 margaritas.  Pt also sues at least an 8 ball of cocaine, daily.  He last used cocaine 2 days ago.  Pt is c/o w/d sxs: tremors, nausea, anxiety, sweats and hot/cold.  Pt denies legal charges at this time.  He has no issues with seizures/blackouts.    Axis I: Alcohol use disorder, Severe; Cocaine use disorder, Severe Axis II: Deferred Axis III:  Past Medical History  Diagnosis Date  . Inguinal hernia   . GERD (gastroesophageal reflux disease)   . Substance abuse   . Depression    Axis IV: other psychosocial or environmental problems, problems related to social environment and problems with primary support group Axis V: 51-60 moderate symptoms  Past Medical History:  Past Medical History  Diagnosis Date  . Inguinal hernia   . GERD (gastroesophageal reflux disease)   . Substance abuse   . Depression     Past Surgical History  Procedure Laterality Date  . Hernia repair Bilateral 1978    6-8 mos old  . Inguinal hernia repair Left 03/18/2013    Procedure: LAPAROSCOPIC INGUINAL HERNIA WITH MESH;  Surgeon: Lodema Pilot, DO;  Location: [facility];  Service: General;  Laterality: Left;  . Insertion of mesh Left 03/18/2013    Procedure: INSERTION OF MESH;  Surgeon: Lodema Pilot, DO;  Location: [facility];  Service: General;  Laterality: Left;    Family History: No family history on file.  Social History:  reports that he has been smoking Cigarettes.  He has a 15 pack-year smoking history. He has never used smokeless tobacco. He reports that he drinks alcohol. He reports that he uses illicit drugs (Cocaine).  Additional Social History:   Alcohol / Drug Use Pain Medications: Norco  Prescriptions: None  Over the Counter: None  History of alcohol / drug use?: Yes Longest period of sobriety (when/how long): None  Negative Consequences of Use: Work / School;Personal relationships;Financial;Legal Withdrawal Symptoms: Fever / Chills;Nausea / Vomiting;Sweats Substance #1 Name of Substance 1: Alcohol  1 - Age of First Use: 15 YOM  1 - Amount (size/oz): 12-18 pk  1 - Frequency: Daily  1 - Duration: On-going  1 - Last Use / Amount: 02/01/14 Substance #2 Name of Substance 2: Cocaine  2 - Age of First Use: 18 YOM  2 - Amount (size/oz): 8 Ball  2 - Frequency: Daily  2 - Duration: On-going  2 - Last Use / Amount: 2 Days Ago   CIWA: CIWA-Ar BP: 123/58 mmHg Pulse Rate: 69 Nausea and Vomiting: mild nausea with no vomiting Tactile Disturbances: none Tremor: two Auditory Disturbances: not present Paroxysmal Sweats: three Visual Disturbances: not present Anxiety: two Headache, Fullness in Head: none present Agitation: normal activity Orientation and Clouding of Sensorium: oriented and can do serial additions CIWA-Ar Total: 8 COWS:    PATIENT STRENGTHS: (choose at least two) Communication skills Motivation for treatment/growth  Allergies: No Known Allergies  Home Medications:  (Not in a hospital admission)  OB/GYN Status:  No LMP for male patient.  General Assessment Data Location of Assessment: [facility] Is this a  Tele or Face-to-Face Assessment?: Face-to-Face Is this an Initial Assessment or a Re-assessment for this encounter?: Initial Assessment Living Arrangements: Alone Can pt return to current living arrangement?: Yes Admission Status: Voluntary Is patient capable of signing voluntary admission?: Yes Transfer from: Acute Hospital Referral Source: MD  Medical Screening Exam [facility] Walk-in ONLY) Medical Exam completed: No Reason for MSE not completed: Other: (None)  [facility] Living Arrangements:  Alone Name of Psychiatrist: None  Name of Therapist: None   Education Status Is patient currently in school?: No Current Grade: None  Highest grade of school patient has completed: None  Name of school: None  Contact person: None   Risk to self with the past 6 months Suicidal Ideation: No Suicidal Intent: No Is patient at risk for suicide?: No Suicidal Plan?: No Access to Means: No What has been your use of drugs/alcohol within the last 12 months?: Abusing: alcohol, cocaine  Previous Attempts/Gestures: No How many times?: 0 Other Self Harm Risks: None  Triggers for Past Attempts: None known Intentional Self Injurious Behavior: None Family Suicide History: No Recent stressful life event(s): Conflict (Comment) (Recent break up ) Persecutory voices/beliefs?: No Depression: Yes Depression Symptoms: Loss of interest in usual pleasures Substance abuse history and/or treatment for substance abuse?: Yes Suicide prevention information given to non-admitted patients: Not applicable  Risk to Others within the past 6 months Homicidal Ideation: No Thoughts of Harm to Others: No Current Homicidal Intent: No Current Homicidal Plan: No Access to Homicidal Means: No Identified Victim: None  History of harm to others?: No Assessment of Violence: None Noted Violent Behavior Description: None  Does patient have access to weapons?: No Criminal Charges Pending?: No Does patient have a court date: No  Psychosis Hallucinations: None noted Delusions: None noted  Mental Status Report Appear/Hygiene: Disheveled;In scrubs Eye Contact: Good Motor Activity: Unremarkable Speech: Logical/coherent;Soft Level of Consciousness: Alert Mood: Depressed Affect: Depressed Anxiety Level: None Thought Processes: Coherent;Relevant Judgement: Unimpaired Orientation: Person;Place;Time;Situation Obsessive Compulsive Thoughts/Behaviors: None  Cognitive Functioning Concentration: Normal Memory:  Recent Intact;Remote Intact IQ: Average Insight: Fair Impulse Control: Fair Appetite: Fair Weight Loss: 0 Weight Gain: 0 Sleep: No Change Total Hours of Sleep: 6 Vegetative Symptoms: None  ADLScreening [facility] Assessment Services) Patient's cognitive ability adequate to safely complete daily activities?: Yes Patient able to express need for assistance with ADLs?: Yes Independently performs ADLs?: Yes (appropriate for developmental age)  Prior Inpatient Therapy Prior Inpatient Therapy: No Prior Therapy Dates: None  Prior Therapy Facilty/Provider(s): None  Reason for Treatment: None   Prior Outpatient Therapy Prior Outpatient Therapy: No Prior Therapy Dates: None  Prior Therapy Facilty/Provider(s): None  Reason for Treatment: None   ADL Screening (condition at time of admission) Patient's cognitive ability adequate to safely complete daily activities?: Yes Is the patient deaf or have difficulty hearing?: No Does the patient have difficulty seeing, even when wearing glasses/contacts?: No Does the patient have difficulty concentrating, remembering, or making decisions?: No Patient able to express need for assistance with ADLs?: Yes Does the patient have difficulty dressing or bathing?: No Independently performs ADLs?: Yes (appropriate for developmental age) Does the patient have difficulty walking or climbing stairs?: No Weakness of Legs: None Weakness of Arms/Hands: None  Home Assistive Devices/Equipment Home Assistive Devices/Equipment: None  Therapy Consults (therapy consults require a physician order) PT Evaluation Needed: No OT Evalulation Needed: No SLP Evaluation Needed: No Abuse/Neglect Assessment (Assessment to be complete while patient is alone) Physical Abuse: Denies Verbal Abuse: Denies Sexual Abuse: Denies  Exploitation of patient/patient's resources: Denies Self-Neglect: Denies Values / Beliefs Cultural Requests During Hospitalization: None Spiritual  Requests During Hospitalization: None Consults Spiritual Care Consult Needed: No Social Work Consult Needed: No Merchant navy officer (For Healthcare) Does patient have an advance directive?: No Would patient like information on creating an advanced directive?: No - patient declined information Nutrition Screen- [facility] Patient's home diet: Regular  Additional Information 1:1 In Past 12 Months?: No CIRT Risk: No Elopement Risk: No Does patient have medical clearance?: Yes     Disposition:  Disposition Initial Assessment Completed for this Encounter: Yes Disposition of Patient: Inpatient treatment program;Referred to (Pls refer to [facility] ) Type of inpatient treatment program: Adult Patient referred to: Other (Comment) (Pls refer to [facility])  Beatrix Shipper C 02/03/2014 3:32 AM

## 2014-02-04 DIAGNOSIS — F1994 Other psychoactive substance use, unspecified with psychoactive substance-induced mood disorder: Secondary | ICD-10-CM

## 2014-02-04 DIAGNOSIS — R45851 Suicidal ideations: Secondary | ICD-10-CM

## 2014-02-04 DIAGNOSIS — F1020 Alcohol dependence, uncomplicated: Secondary | ICD-10-CM | POA: Diagnosis present

## 2014-02-04 MED ORDER — CHLORDIAZEPOXIDE HCL 25 MG PO CAPS: 25.0000 mg | ORAL_CAPSULE | ORAL | Status: DC

## 2014-02-04 MED ORDER — ACETAMINOPHEN 325 MG PO TABS
650.0000 mg | ORAL_TABLET | Freq: Four times a day (QID) | ORAL | Status: DC | PRN
  Administered 2014-02-04 – 2014-02-05 (×2): 650 mg via ORAL
  Filled 2014-02-04: qty 2

## 2014-02-04 MED ORDER — CHLORDIAZEPOXIDE HCL 25 MG PO CAPS: 25.0000 mg | ORAL_CAPSULE | Freq: Every day | ORAL | Status: DC

## 2014-02-04 MED ORDER — CHLORDIAZEPOXIDE HCL 25 MG PO CAPS
25.0000 mg | ORAL_CAPSULE | Freq: Four times a day (QID) | ORAL | Status: DC | PRN
Start: 2014-02-04 — End: 2014-02-04

## 2014-02-04 MED ORDER — VITAMIN B-1 100 MG PO TABS
100.0000 mg | ORAL_TABLET | Freq: Every day | ORAL | Status: DC
  Filled 2014-02-04 (×2): qty 1

## 2014-02-04 MED ORDER — MAGNESIUM HYDROXIDE 400 MG/5ML PO SUSP
30.0000 mL | Freq: Every day | ORAL | Status: DC | PRN
Start: 2014-02-04 — End: 2014-02-04

## 2014-02-04 MED ORDER — CHLORDIAZEPOXIDE HCL 25 MG PO CAPS: 25.0000 mg | ORAL_CAPSULE | Freq: Three times a day (TID) | ORAL | Status: DC

## 2014-02-04 MED ORDER — NAPROXEN 500 MG PO TABS
500.0000 mg | ORAL_TABLET | Freq: Two times a day (BID) | ORAL | Status: DC
  Administered 2014-02-04 – 2014-02-06 (×4): 500 mg via ORAL
  Filled 2014-02-04 (×8): qty 1

## 2014-02-04 MED ORDER — ALUM & MAG HYDROXIDE-SIMETH 200-200-20 MG/5ML PO SUSP: 30.0000 mL | ORAL | Status: DC | PRN

## 2014-02-04 MED ORDER — THIAMINE HCL 100 MG/ML IJ SOLN
100.0000 mg | Freq: Once | INTRAMUSCULAR | Status: AC
  Administered 2014-02-04: 100 mg via INTRAMUSCULAR
  Filled 2014-02-04: qty 2

## 2014-02-04 MED ORDER — LOPERAMIDE HCL 2 MG PO CAPS: 2.0000 mg | ORAL_CAPSULE | ORAL | Status: DC | PRN

## 2014-02-04 MED ORDER — ONDANSETRON 4 MG PO TBDP: 4.0000 mg | ORAL_TABLET | Freq: Four times a day (QID) | ORAL | Status: DC | PRN

## 2014-02-04 MED ORDER — ADULT MULTIVITAMIN W/MINERALS CH
1.0000 | ORAL_TABLET | Freq: Every day | ORAL | Status: DC
  Administered 2014-02-04: 1 via ORAL
  Filled 2014-02-04 (×4): qty 1

## 2014-02-04 MED ORDER — CHLORDIAZEPOXIDE HCL 25 MG PO CAPS
25.0000 mg | ORAL_CAPSULE | Freq: Four times a day (QID) | ORAL | Status: AC
  Administered 2014-02-04 – 2014-02-05 (×4): 25 mg via ORAL
  Filled 2014-02-04 (×4): qty 1

## 2014-02-04 MED ORDER — HYDROXYZINE HCL 25 MG PO TABS
25.0000 mg | ORAL_TABLET | Freq: Four times a day (QID) | ORAL | Status: DC | PRN
Start: 2014-02-04 — End: 2014-02-04

## 2014-02-04 NOTE — Discharge Instructions (Addendum)
(  The following are tentative discharge instructions based upon your needs as of Friday 02/04/2014 at 6:30 pm.  They may be revised depending upon your treatment needs over the next 12 - 24 hours).  To help you maintain a sober lifestyle a residential program may be helpful to you.  Consider contacting one or more of the following facilities to ask about admission to their programs:  [facility] [address] [place], [name] [address] [phone]  [facility] [address],  [place], [name] [address] [phone]  [facility]  [address]  [place], [name] [address] [phone]  [facility] [address] [place], [name] [address] [phone]  [facility]  [address] [address]  [place], [name] [address] [phone]  [email or web address]   [facility]  [address]  [place], [name] [address]  [phone]  [email or web address]  Patient provided with SA resources, and Mental Health resources.

## 2014-02-04 NOTE — Progress Notes (Signed)
[facility]  02/04/2014 6:34 PM Debbra RidingMark L Canto  MRN:  [id number] Subjective:  Loraine LericheMark continues to endorse that he is not doing well. States he has never pursued treatment before and that if does not do something now he is going to end up dead. States he is concerned about being out there the way he is feeling Diagnosis:   Substance/Addictive Disorders:  Alcohol Related Disorder - Severe (303.90) Depressive Disorders:  Major Depressive Disorder - Moderate (296.22) Total Time spent with patient: 30 minutes  Axis I: Substance Induced Mood Disorder  ADL's:  Intact  Sleep: Poor  Appetite:  Poor  Psychiatric Specialty Exam: Physical Exam  Review of Systems  Constitutional: Positive for malaise/fatigue.  HENT: Negative.   Eyes: Negative.   Respiratory: Negative.   Cardiovascular: Negative.   Gastrointestinal: Negative.   Genitourinary: Negative.   Musculoskeletal: Negative.   Skin: Negative.   Neurological: Positive for dizziness and weakness.  Endo/Heme/Allergies: Negative.   Psychiatric/Behavioral: Positive for depression and substance abuse. The patient is nervous/anxious and has insomnia.     Blood pressure 127/66, pulse 79, temperature 98 F (36.7 C), temperature source Oral, resp. rate 20, SpO2 98.00%.There is no weight on file to calculate BMI.  General Appearance: Disheveled  Eye SolicitorContact::  Fair  Speech:  Clear and Coherent  Volume:  fluctuates  Mood:  Anxious and Depressed  Affect:  anxious, depressed  Thought Process:  Coherent and Goal Directed  Orientation:  Full (Time, Place, and Person)  Thought Content:  events symptoms worries concerns  Suicidal Thoughts:  Wanting to give up  Homicidal Thoughts:  No  Memory:  Immediate;   Fair Recent;   Fair Remote;   Fair  Judgement:  Fair  Insight:  Present  Psychomotor Activity:  Restlessness  Concentration:  Fair  Recall:  FiservFair  Fund of Knowledge:NA  Language: Fair  Akathisia:  No  Handed:    AIMS (if  indicated):     Assets:  Desire for Improvement  Sleep:      Musculoskeletal: Strength & Muscle Tone: within normal limits Gait & Station: normal Patient leans: N/A  Current Medications: Current Facility-Administered Medications  Medication Dose Route Frequency Provider Last Rate Last Dose  . acetaminophen (TYLENOL) tablet 650 mg  650 mg Oral Q4H PRN Benjaman PottGerald D Taylor, MD      . alum & mag hydroxide-simeth (MAALOX/MYLANTA) 200-200-20 MG/5ML suspension 30 mL  30 mL Oral Q4H PRN Benjaman PottGerald D Taylor, MD      . chlordiazePOXIDE (LIBRIUM) capsule 25 mg  25 mg Oral Q6H PRN Benjaman PottGerald D Taylor, MD      . Melene Muller[START ON 02/05/2014] chlordiazePOXIDE (LIBRIUM) capsule 25 mg  25 mg Oral BH-qamhs Benjaman PottGerald D Taylor, MD       Followed by  . [START ON 02/06/2014] chlordiazePOXIDE (LIBRIUM) capsule 25 mg  25 mg Oral Daily Benjaman PottGerald D Taylor, MD      . hydrOXYzine (ATARAX/VISTARIL) tablet 25 mg  25 mg Oral Q6H PRN Benjaman PottGerald D Taylor, MD   25 mg at 02/04/14 0012  . loperamide (IMODIUM) capsule 2-4 mg  2-4 mg Oral PRN Benjaman PottGerald D Taylor, MD      . magnesium hydroxide (MILK OF MAGNESIA) suspension 30 mL  30 mL Oral Daily PRN Benjaman PottGerald D Taylor, MD      . multivitamin with minerals tablet 1 tablet  1 tablet Oral Daily Benjaman PottGerald D Taylor, MD   1 tablet at 02/04/14 0750  . naproxen (NAPROSYN) tablet 500 mg  500  mg Oral BID WC Rachael Fee, MD   500 mg at 02/04/14 1735  . nicotine (NICODERM CQ - dosed in mg/24 hours) patch 21 mg  21 mg Transdermal Daily Benjaman Pott, MD   21 mg at 02/04/14 0749  . ondansetron (ZOFRAN-ODT) disintegrating tablet 4 mg  4 mg Oral Q6H PRN Benjaman Pott, MD      . thiamine (VITAMIN B-1) tablet 100 mg  100 mg Oral Daily Benjaman Pott, MD   100 mg at 02/04/14 0750  . zolpidem (AMBIEN) tablet 5 mg  5 mg Oral QHS PRN Benjaman Pott, MD   5 mg at 02/03/14 2133    Lab Results: No results found for this or any previous visit (from the past 48 hour(s)).  Physical Findings: AIMS: Facial and Oral Movements Muscles  of Facial Expression: None, normal Lips and Perioral Area: None, normal Jaw: None, normal Tongue: None, normal,Extremity Movements Upper (arms, wrists, hands, fingers): None, normal Lower (legs, knees, ankles, toes): None, normal, Trunk Movements Neck, shoulders, hips: None, normal, Overall Severity Severity of abnormal movements (highest score from questions above): None, normal Incapacitation due to abnormal movements: None, normal Patient's awareness of abnormal movements (rate only patient's report): No Awareness, Dental Status Current problems with teeth and/or dentures?: No Does patient usually wear dentures?: No  CIWA:  CIWA-Ar Total: 0 COWS:     Treatment Plan Summary: Daily contact with patient to assess and evaluate symptoms and progress in treatment Medication management  Plan: Supportive approach/coping skills/relapse prevention           Continue detox           Address the co morbidities           Explore placement options Medical Decision Making Problem Points:  Review of psycho-social stressors (1) Data Points:  Review of medication regiment & side effects (2)  I certify that inpatient services furnished can reasonably be expected to improve the patient's condition.   Ellissa Ayo A 02/04/2014, 6:34 PM

## 2014-02-04 NOTE — Progress Notes (Signed)
D:  Patient seen watching TV and interacting with peer. Patient stated " I'm all right now compared to bunch of shit. I            went through'. Patient endorses chronic back pain and complained of night sweating. Offered and accepted PO            PRN of acetaminophen 650 mg for back pain. Patient denied SI, AH/VH. No new complaint. A: patient offered support and encouragement. Encouraged to adhere to the treatment plan and medication regimen.          Due medications given as ordered. R: Patient receptive. Accepted medications.      Safety maintained. Will continue to monitor patient.

## 2014-02-04 NOTE — Progress Notes (Signed)
Patient ID: Debbra RidingMark L Mode, male   DOB: 09/18/1975, 38 y.o.   MRN: [id number] D: Patient is alert and oriented.  He complains of some withdrawals symptoms such as sweating and body aches.  Patient denies any SI/HI/AVH.  Patient is very concerned about being discharged.  He states, "they wanted to kick me out of the ED.  I told them if I left, I would just go out and use again."  Patient is here for alcohol and cocaine abuse.  He has been using since 19 and has not had any periods of sobriety.  He has a telephone interview with [facility] on Friday and is worried about his discharge here.  Patient is also interested in transferring to  [facility] For further treatment.  Patient states he is depressed over the breakup with his fiance.  He believes this is his "rock bottom."  A: encourage and support patient as needed. Keep him informed about status of admit/discharge.  Monitor medication management and MD orders.  Safety checks completed every 15 minutes per protocol. R: Patient is cooperative and his behavior is appropriate to situation.

## 2014-02-04 NOTE — Plan of Care (Addendum)
[facility] Observation Crisis Plan  Reason for Crisis Plan:  Substance Abuse   Plan of Care:  Referral for Substance Abuse  Family Support:    Mother, brother, and 37 y/o daughter are supportive, but pt cannot live with them.  His daughter, however, will be able to provide transportation for the pt to a treatment facility.  He recently broke up with his fiancee with whom he was living.  Current Living Environment:  Living Arrangements: Alone; pt is currently homeless.  Insurance:  Self pay Hospital Account   Name Acct ID Class Status Primary Coverage   Adam Pierce, Adam Pierce [id number] BEHAVIORAL HEALTH OBSERVATION Open None        Guarantor Account (for Hospital Account [id number])   Name Relation to Pt Service Area Active? Acct Type   Debbra Riding Self [facility] Yes Behavioral Health   Address Phone       [address] [place], [place] [address] [phone](H)          Coverage Information (for Hospital Account [id number])   Not on file      Legal Guardian:   Self  Primary Care Provider:  No PCP Per Patient  Current Outpatient Providers:  None; pt has no history of treatment  Psychiatrist:   None  Counselor/Therapist:   None  Compliant with Medications:  Yes; pt has old prescription for Norco.  No other medications.  Additional Information: After consulting with Geoffery Lyons, MD it has been determined that pt does not present a life threatening danger to himself or others, and that psychiatric hospitalization is not indicated for him at this time.  Pt would, however, benefit from admission to a residential substance abuse treatment program for the time being.  He has an interview with [facility] scheduled for 1 week from today (Friday, 02/09/2014).  Pt signed Consent to Release Information to [facility], [facility], and [facility].  Referrals were faxed to [facility] and to [facility].  At 14:40 Lily Peer from [facility] called back, reporting that pt has been declined due to a court date  that he missed today.  Response from [facility] is pending as of this writing.  Doylene Canning, MA Triage Specialist Raphael Gibney 10/2/20153:33 PM  Addendum: Pt reports that the court date to which Lily Peer referred was for a child support claim against him.  He believes that his case can be continued and asks that I call Nettie Elm, the child support case worker assigned to his case.  He signed Consent to Release Information to her.  At 16:30 I called [facility] Support in an effort to reach her, but she was reportedly gone for the day.  Pt also signed Consent to Release Information to [facility] in [place], [place].  At 17:45 I called them.  The respondent reports that pt would need to talk to Vidal Schwalbe on Monday (02/07/2014), and they would not be able to even consider pt before then.  At 17:15 I called [facility] and spoke to [place].  A decision regarding his referral is still pending at this time.  Steward Drone was provided with contact information for the Observation Unit in case this writer's shift is over before they render a decision.  Pt's case was staffed with Geoffery Lyons, MD.  He is agreeable to keeping pt in the Observation Unit for another night (02/04/2014 - 02/05/2014) and may even recommend admission to [facility] tomorrow depending on pt's clinical presentation at the time.  Pt's discharge instructions will contain tentative recommendations  for residential treatment providers in the area, in case he needs to follow up on his own.  Doylene Canninghomas Arye Weyenberg, MA Triage Specialist 02/04/2014 @ 18:08

## 2014-02-05 DIAGNOSIS — F14929 Cocaine use, unspecified with intoxication, unspecified: Secondary | ICD-10-CM

## 2014-02-05 DIAGNOSIS — F1099 Alcohol use, unspecified with unspecified alcohol-induced disorder: Secondary | ICD-10-CM

## 2014-02-05 NOTE — Progress Notes (Signed)
[facility] MD Progress Note  02/05/2014 12:09 PM Adam Pierce  MRN:  [id number] Subjective:  Patient is admitted to our Observation unit for alcohol and cocaine detox treatment.  This is his first time seeking treatment for his addiction.  Patient reports he has been drinking since age 38 and have been using Cocaine since age 38.  He has children and a fiance who sent him out of their home because of his addiction.  Patient is ready to stop using and get his family back together.  We have started his detox treatment with Librium per our protocol.  We will admit patient to any available bed to continue treatment.  He denies SI/HI/AVH.  We will seek placement at any facility with available bed and also [facility]. Diagnosis:   DSM5: Schizophrenia Disorders:   Obsessive-Compulsive Disorders:   Trauma-Stressor Disorders:   Substance/Addictive Disorders:  Alcohol Related Disorder - Severe (303.90), Cocaine use disorder, severe Depressive Disorders:   Total Time spent with patient: 30 minutes  Axis I: Alcohol use disorder severe, Cocaine use disorder, severe Axis II: Deferred Axis III:  Past Medical History  Diagnosis Date  . Inguinal hernia   . GERD (gastroesophageal reflux disease)   . Substance abuse   . Depression    Axis IV: other psychosocial or environmental problems and problems related to social environment Axis V: 41-50 serious symptoms  ADL's:  Intact  Sleep: Good  Appetite:  Good  Suicidal Ideation:  Denies Homicidal Ideation:  Denies AEB (as evidenced by):  Psychiatric Specialty Exam: Physical Exam  ROS  Blood pressure 138/63, pulse 62, temperature 97.8 F (36.6 C), temperature source Oral, resp. rate 16, SpO2 100.00%.There is no weight on file to calculate BMI.  General Appearance: Casual and Fairly Groomed  Eye Contact::  Good  Speech:  Clear and Coherent and Normal Rate  Volume:  Normal  Mood:  Anxious and  Depressed  Affect:  Congruent and Depressed  Thought Process:  Coherent, Goal Directed and Intact  Orientation:  Full (Time, Place, and Person)  Thought Content:  WDL  Suicidal Thoughts:  No  Homicidal Thoughts:  No  Memory:  Immediate;   Good Recent;   Good Remote;   Good  Judgement:  Fair  Insight:  Present  Psychomotor Activity:  Tremor  Concentration:  Good  Recall:  NA  Fund of Knowledge:Good  Language: Good  Akathisia:  NA  Handed:  Right  AIMS (if indicated):     Assets:  Desire for Improvement  Sleep:      Musculoskeletal: Strength & Muscle Tone: within normal limits Gait & Station: normal Patient leans: N/A  Current Medications: Current Facility-Administered Medications  Medication Dose Route Frequency Provider Last Rate Last Dose  . acetaminophen (TYLENOL) tablet 650 mg  650 mg Oral Q6H PRN Benjaman PottGerald D Taylor, MD   650 mg at 02/04/14 1948  . alum & mag hydroxide-simeth (MAALOX/MYLANTA) 200-200-20 MG/5ML suspension 30 mL  30 mL Oral Q4H PRN Benjaman PottGerald D Taylor, MD      . chlordiazePOXIDE (LIBRIUM) capsule 25 mg  25 mg Oral Q6H PRN Benjaman PottGerald D Taylor, MD      . chlordiazePOXIDE (LIBRIUM) capsule 25 mg  25 mg Oral BH-qamhs Benjaman PottGerald D Taylor, MD       Followed by  . [START ON 02/06/2014] chlordiazePOXIDE (LIBRIUM) capsule 25 mg  25 mg Oral Daily Benjaman PottGerald D Taylor, MD      . chlordiazePOXIDE (  LIBRIUM) capsule 25 mg  25 mg Oral QID Benjaman Pott, MD   25 mg at 02/05/14 1127   Followed by  . [START ON 02/06/2014] chlordiazePOXIDE (LIBRIUM) capsule 25 mg  25 mg Oral TID Benjaman Pott, MD       Followed by  . [START ON 02/07/2014] chlordiazePOXIDE (LIBRIUM) capsule 25 mg  25 mg Oral BH-qamhs Benjaman Pott, MD       Followed by  . [START ON 02/08/2014] chlordiazePOXIDE (LIBRIUM) capsule 25 mg  25 mg Oral Daily Benjaman Pott, MD      . hydrOXYzine (ATARAX/VISTARIL) tablet 25 mg  25 mg Oral Q6H PRN Benjaman Pott, MD   25 mg at 02/04/14 0012  . loperamide (IMODIUM) capsule 2-4 mg   2-4 mg Oral PRN Benjaman Pott, MD      . magnesium hydroxide (MILK OF MAGNESIA) suspension 30 mL  30 mL Oral Daily PRN Benjaman Pott, MD   30 mL at 02/05/14 1124  . multivitamin with minerals tablet 1 tablet  1 tablet Oral Daily Benjaman Pott, MD   1 tablet at 02/05/14 4098  . naproxen (NAPROSYN) tablet 500 mg  500 mg Oral BID WC Rachael Fee, MD   500 mg at 02/05/14 1191  . nicotine (NICODERM CQ - dosed in mg/24 hours) patch 21 mg  21 mg Transdermal Daily Benjaman Pott, MD   21 mg at 02/05/14 0718  . ondansetron (ZOFRAN-ODT) disintegrating tablet 4 mg  4 mg Oral Q6H PRN Benjaman Pott, MD      . thiamine (VITAMIN B-1) tablet 100 mg  100 mg Oral Daily Benjaman Pott, MD   100 mg at 02/05/14 4782  . zolpidem (AMBIEN) tablet 5 mg  5 mg Oral QHS PRN Benjaman Pott, MD   5 mg at 02/04/14 2211    Lab Results: No results found for this or any previous visit (from the past 48 hour(s)).  Physical Findings: AIMS: Facial and Oral Movements Muscles of Facial Expression: None, normal Lips and Perioral Area: None, normal Jaw: None, normal Tongue: None, normal,Extremity Movements Upper (arms, wrists, hands, fingers): None, normal Lower (legs, knees, ankles, toes): None, normal, Trunk Movements Neck, shoulders, hips: None, normal, Overall Severity Severity of abnormal movements (highest score from questions above): None, normal Incapacitation due to abnormal movements: None, normal Patient's awareness of abnormal movements (rate only patient's report): No Awareness, Dental Status Current problems with teeth and/or dentures?: No Does patient usually wear dentures?: No  CIWA:  CIWA-Ar Total: 6 COWS:     Treatment Plan Summary: Daily contact with patient to assess and evaluate symptoms and progress in treatment Medication management Waiting for bed placement at any n facility with available bed  Plan:  Medical Decision Making:  Patient meets criteria for inpatient detox treatment.  We  will admit patient to our Chemical dependency inpatient treatment and will be moving him as soon as a bed is available.  Meanwhile patient will be in our observation unit where we are using Librium protocol for his treatment. Problem Points:   Data Points:    Earney Navy  PMHNP-BC 02/05/2014, 12:09 PM  Patient seen, evaluated and I agree with notes by Nurse Practitioner. Thedore Mins, MD

## 2014-02-05 NOTE — Progress Notes (Signed)
Patient complained of night mares and constant back pain. Requesting for a stronger "pain killer". Aggie NP, notified. No new order. Ordered to continue with the naproxen 500 mg twice daily. Accepted acetaminophen 50 mg for the pain. Patient reports tremor as the symptoms of withdrawals at the moment. Denies AH/VH. Offered support and encouragement to the patient. Every 15 minutes check maintained for safety. Will continue to monitor patient.

## 2014-02-05 NOTE — Progress Notes (Signed)
Pt alert, oriented and cooperative. -SI/HI, -A/vhall , verbally contacts for safety. Concerned about being discharged "I'm going through withdrawal, I don't feel good". See MAR.  Affect/mood sad and depressed.  Will continue to monitor closely and evaluate for stabilization.

## 2014-02-06 NOTE — Progress Notes (Signed)
Patient irrate and threatening to staff when informed that he was being discharged today. He stated,"I am going to go off and end up in jail because you're putting me out." patient angry, cussing and posturing with staff. [facility] associatewriter until patient discharged. Patient provided with a comprehensive list of outpatient and residential treatment options as well as with mobile crisis line. All belongings returned. [facility]. Patient stated, "I only said I was suicidal to get in here."

## 2014-02-06 NOTE — Progress Notes (Signed)
Pt was told yesterday that he would be waiting in the observation unit until a bed in an inpatient facility was available. Today pt was told that there are no beds available and he that he is being discharged because he has been here over 24 hours. Pt became upset and began cursing and stated "I bet if I were to say I was suicidal, y'all would admit me." Pt stated "Come on and give me my clothes. I need my clothes like yesterday." Pt signed an AMA form so that staff could get him off the unit as he was beginning to escalate. Pt used the phone multiple times to call for a ride. Pt then took a shower. Pt did apologize to staff saying he was just upset with the way the doctor handled the situation.

## 2014-02-06 NOTE — [facility] Suicide Risk Assessment (Cosign Needed)
Suicide Risk Assessment  Discharge Assessment     Demographic Factors:  Caucasian, Living alone and Unemployed  Total Time spent with patient: 30 minutes  Psychiatric Specialty Exam:     Blood pressure 125/64, pulse 78, temperature 97.8 F (36.6 C), temperature source Oral, resp. rate 16, SpO2 100.00%.There is no weight on file to calculate BMI.  General Appearance: Casual  Eye Contact::  Good  Speech:  Clear and Coherent and Normal Rate  Volume:  Normal  Mood:  Euthymic  Affect:  Congruent  Thought Process:  Goal Directed and Intact  Orientation:  Full (Time, Place, and Person)  Thought Content:  WDL  Suicidal Thoughts:  No  Homicidal Thoughts:  No  Memory:  Immediate;   Good Recent;   Good Remote;   Good  Judgement:  Good  Insight:  Good  Psychomotor Activity:  Normal  Concentration:  Good  Recall:  NA  Fund of Knowledge:Good  Language: Good  Akathisia:  NA  Handed:  Right  AIMS (if indicated):     Assets:  Desire for Improvement Housing  Sleep:       Musculoskeletal: Strength & Muscle Tone: within normal limits Gait & Station: normal Patient leans: N/A   Mental Status Per Nursing Assessment::   On Admission:  NA  Current Mental Status by Physician: NA  Loss Factors: NA  Historical Factors: NA  Risk Reduction Factors:   Positive coping skills or problem solving skills  Continued Clinical Symptoms:  Alcohol/Substance Abuse/Dependencies  Cognitive Features That Contribute To Risk:  Polarized thinking    Suicide Risk:  Minimal: No identifiable suicidal ideation.  Patients presenting with no risk factors but with morbid ruminations; may be classified as minimal risk based on the severity of the depressive symptoms  Discharge Diagnoses:   AXIS I:  alcohol dependence AXIS II:  Deferred AXIS III:   Past Medical History  Diagnosis Date  . Inguinal hernia   . GERD (gastroesophageal reflux disease)   . Substance abuse   . Depression    AXIS  IV:  housing problems, occupational problems, other psychosocial or environmental problems and problems related to social environment AXIS V:  61-70 mild symptoms  Plan Of Care/Follow-up recommendations:  Activity:  As tolerated Diet:  Regular  Is patient on multiple antipsychotic therapies at discharge:  No   Has Patient had three or more failed trials of antipsychotic monotherapy by history:  No  Recommended Plan for Multiple Antipsychotic Therapies: NA    Ivan Maskell, C    PMHNP-BC 02/06/2014, 10:17 AM

## 2014-02-06 NOTE — Discharge Summary (Signed)
Physician Discharge Summary Note  Patient:  Adam Pierce is an 38 y.o., male MRN:  [id number] DOB:  04/30/1976 Patient phone:  [phone] (home)  Patient address[place]   [address] [name] [id number],  Total Time spent with patient: 30 minutes  Date of Admission:  02/03/2014 Date of Discharge: 02/06/14  Reason for Admission:  Seeking detox from Alcohol and cocaine  Discharge Diagnoses: Active Problems:   Alcohol dependence   Alcohol dependency   Psychiatric Specialty Exam: Physical Exam  ROS  Blood pressure 125/64, pulse 78, temperature 97.8 F (36.6 C), temperature source Oral, resp. rate 16, SpO2 100.00%.There is no weight on file to calculate BMI.  General Appearance: Casual  Eye Contact::  Good  Speech:  Clear and Coherent and Normal Rate  Volume:  Normal  Mood:  Euthymic  Affect:  Congruent  Thought Process:  Coherent, Goal Directed and Intact  Orientation:  Full (Time, Place, and Person)  Thought Content:  WDL  Suicidal Thoughts:  No  Homicidal Thoughts:  No  Memory:  Immediate;   Good Recent;   Good Remote;   Good  Judgement:  Good  Insight:  Good  Psychomotor Activity:  Normal  Concentration:  Good  Recall:  NA  Fund of Knowledge:Good  Language: Good  Akathisia:  NA  Handed:  Right  AIMS (if indicated):     Assets:  Desire for Improvement Housing  Sleep:       Past Psychiatric History: Diagnosis:  Hospitalizations:  Outpatient Care:  Substance Abuse Care:  Self-Mutilation:  Suicidal Attempts:  Violent Behaviors:   Musculoskeletal: Strength & Muscle Tone: within normal limits Gait & Station: normal Patient leans: N/A  DSM5:  Schizophrenia Disorders:   Obsessive-Compulsive Disorders:   Trauma-Stressor Disorders:   Substance/Addictive Disorders:  Alcohol Related Disorder - Severe (303.90) Depressive Disorders:   Axis Diagnosis:   AXIS I:  Alcohol dependencece, severe AXIS II:  Deferred AXIS III:   Past Medical History   Diagnosis Date  . Inguinal hernia   . GERD (gastroesophageal reflux disease)   . Substance abuse   . Depression    AXIS IV:  housing problems and occupational problems AXIS V:  61-70 mild symptoms  Level of Care:  OP  Hospital Course:  Patient is admitted to our Observation unit for alcohol and cocaine detox treatment. This is his first time seeking treatment for his addiction. Patient reports he has been drinking since age 38 and have been using Cocaine since age 38. He has children and a fiance who sent him out of their home because of his addiction. Patient is ready to stop using and get his family back together. We have started his detox treatment with Librium per our protocol.  Today patient denies SI/HI/AVH.  He denies any alcohol withdrawal symptoms.  Patient agrees that he is no longer in any acute withdrawal crisis and will follow up with outpatient rehabilitation facilities.  Patient also has agreed to retain an outpatient Psychiatrist and therapist for any MH problem.  Patient was seen with Dr Jannifer FranklinAkintayo, Psychiatrist who concur to this assessment  Significant Diagnostic Studies:  None  Discharge Vitals:   Blood pressure 125/64, pulse 78, temperature 97.8 F (36.6 C), temperature source Oral, resp. rate 16, SpO2 100.00%. There is no weight on file to calculate BMI. Lab Results:   No results found for this or any previous visit (from the past 72 hour(s)).  Physical Findings: AIMS: Facial and Oral Movements Muscles of Facial Expression:  None, normal Lips and Perioral Area: None, normal Jaw: None, normal Tongue: None, normal,Extremity Movements Upper (arms, wrists, hands, fingers): None, normal Lower (legs, knees, ankles, toes): None, normal, Trunk Movements Neck, shoulders, hips: None, normal, Overall Severity Severity of abnormal movements (highest score from questions above): None, normal Incapacitation due to abnormal movements: None, normal Patient's awareness of  abnormal movements (rate only patient's report): No Awareness, Dental Status Current problems with teeth and/or dentures?: No Does patient usually wear dentures?: No  CIWA:  CIWA-Ar Total: 0 COWS:     Psychiatric Specialty Exam: See Psychiatric Specialty Exam and Suicide Risk Assessment completed by Attending Physician prior to discharge.  Discharge destination:  Home  Is patient on multiple antipsychotic therapies at discharge:  No   Has Patient had three or more failed trials of antipsychotic monotherapy by history:  No  Recommended Plan for Multiple Antipsychotic Therapies: NA  Discharge Instructions   Diet - low sodium heart healthy    Complete by:  As directed      Discharge instructions    Complete by:  As directed   Follow up with outpatient rehabilitation facilities, information provided. Come back to the ER if you start having any mental health problems.  Follow up with an outpatient Psychiatrist.  Information provided     Increase activity slowly    Complete by:  As directed             Medication List    STOP taking these medications       HYDROcodone-acetaminophen 5-325 MG per tablet  Commonly known as:  NORCO         Follow-up recommendations:  Activity:  As tolerated Diet:  regular  Comments:  Follow up with outpatient rehabilitation facilities, follow up with outpatient Psychiatrist.  Come back to the ER for any mental health issues/problems.  Total Discharge Time:  Greater than 30 minutes.  SignedEarney Navy   PMHNP-BC 02/06/2014, 10:22 AM  Patient seen, evaluated and I agree with notes by Nurse Practitioner. Thedore Mins, MD

## 2014-05-28 ENCOUNTER — Emergency Department (HOSPITAL_COMMUNITY)
Admission: EM | Admit: 2014-05-28 | Discharge: 2014-05-28 | Disposition: A | Discharge status: Home / Self Care | Payer: Self-pay | Attending: Emergency Medicine | Admitting: Emergency Medicine

## 2014-05-28 ENCOUNTER — Encounter (HOSPITAL_COMMUNITY): Payer: Self-pay

## 2014-05-28 DIAGNOSIS — Z79899 Other long term (current) drug therapy: Secondary | ICD-10-CM | POA: Insufficient documentation

## 2014-05-28 DIAGNOSIS — Z720 Tobacco use: Secondary | ICD-10-CM | POA: Insufficient documentation

## 2014-05-28 DIAGNOSIS — K219 Gastro-esophageal reflux disease without esophagitis: Secondary | ICD-10-CM | POA: Insufficient documentation

## 2014-05-28 DIAGNOSIS — Z8659 Personal history of other mental and behavioral disorders: Secondary | ICD-10-CM | POA: Insufficient documentation

## 2014-05-28 DIAGNOSIS — B029 Zoster without complications: Secondary | ICD-10-CM | POA: Insufficient documentation

## 2014-05-28 MED ORDER — NAPROXEN 500 MG PO TABS: 500.0000 mg | ORAL_TABLET | Freq: Two times a day (BID) | ORAL | Status: AC

## 2014-05-28 MED ORDER — ACYCLOVIR 200 MG PO CAPS
ORAL_CAPSULE | ORAL | Status: AC
  Filled 2014-05-28: qty 4

## 2014-05-28 MED ORDER — ACYCLOVIR 400 MG PO TABS
800.0000 mg | ORAL_TABLET | Freq: Once | ORAL | Status: AC
  Administered 2014-05-28: 800 mg via ORAL
  Filled 2014-05-28: qty 2

## 2014-05-28 MED ORDER — ACYCLOVIR 800 MG PO TABS: 800.0000 mg | ORAL_TABLET | Freq: Every day | ORAL | Status: AC

## 2014-05-28 MED ORDER — TRAMADOL HCL 50 MG PO TABS: 50.0000 mg | ORAL_TABLET | Freq: Four times a day (QID) | ORAL | Status: DC | PRN

## 2014-05-28 NOTE — ED Notes (Addendum)
Patient states he started with a localized rash 2 weeks ago to left forearm, since then the rash has gotten bigger and spread. Patient states pain started yesterday in forearm and goes into hand

## 2014-05-28 NOTE — Discharge Instructions (Signed)
If you have to take the narcotic pain medication do not drive as it will make you sleepy. Return as needed for worsening symptoms.  Shingles Shingles (herpes zoster) is an infection that is caused by the same virus that causes chickenpox (varicella). The infection causes a painful skin rash and fluid-filled blisters, which eventually break open, crust over, and heal. It may occur in any area of the body, but it usually affects only one side of the body or face. The pain of shingles usually lasts about 1 month. However, some people with shingles may develop long-term (chronic) pain in the affected area of the body. Shingles often occurs many years after the person had chickenpox. It is more common:  In people older than 50 years.  In people with weakened immune systems, such as those with HIV, AIDS, or cancer.  In people taking medicines that weaken the immune system, such as transplant medicines.  In people under great stress. CAUSES  Shingles is caused by the varicella zoster virus (VZV), which also causes chickenpox. After a person is infected with the virus, it can remain in the person's body for years in an inactive state (dormant). To cause shingles, the virus reactivates and breaks out as an infection in a nerve root. The virus can be spread from person to person (contagious) through contact with open blisters of the shingles rash. It will only spread to people who have not had chickenpox. When these people are exposed to the virus, they may develop chickenpox. They will not develop shingles. Once the blisters scab over, the person is no longer contagious and cannot spread the virus to others. SIGNS AND SYMPTOMS  Shingles shows up in stages. The initial symptoms may be pain, itching, and tingling in an area of the skin. This pain is usually described as burning, stabbing, or throbbing.In a few days or weeks, a painful red rash will appear in the area where the pain, itching, and tingling were  felt. The rash is usually on one side of the body in a band or belt-like pattern. Then, the rash usually turns into fluid-filled blisters. They will scab over and dry up in approximately 2-3 weeks. Flu-like symptoms may also occur with the initial symptoms, the rash, or the blisters. These may include:  Fever.  Chills.  Headache.  Upset stomach. DIAGNOSIS  Your health care provider will perform a skin exam to diagnose shingles. Skin scrapings or fluid samples may also be taken from the blisters. This sample will be examined under a microscope or sent to a lab for further testing. TREATMENT  There is no specific cure for shingles. Your health care provider will likely prescribe medicines to help you manage the pain, recover faster, and avoid long-term problems. This may include antiviral drugs, anti-inflammatory drugs, and pain medicines. HOME CARE INSTRUCTIONS   Take a cool bath or apply cool compresses to the area of the rash or blisters as directed. This may help with the pain and itching.   Take medicines only as directed by your health care provider.   Rest as directed by your health care provider.  Keep your rash and blisters clean with mild soap and cool water or as directed by your health care provider.  Do not pick your blisters or scratch your rash. Apply an anti-itch cream or numbing creams to the affected area as directed by your health care provider.  Keep your shingles rash covered with a loose bandage (dressing).  Avoid skin contact with:  Babies.   Pregnant women.   Children with eczema.   Elderly people with transplants.   People with chronic illnesses, such as leukemia or AIDS.   Wear loose-fitting clothing to help ease the pain of material rubbing against the rash.  Keep all follow-up visits as directed by your health care provider.If the area involved is on your face, you may receive a referral for a specialist, such as an eye doctor  (ophthalmologist) or an ear, nose, and throat (ENT) doctor. Keeping all follow-up visits will help you avoid eye problems, chronic pain, or disability.  SEEK IMMEDIATE MEDICAL CARE IF:   You have facial pain, pain around the eye area, or loss of feeling on one side of your face.  You have ear pain or ringing in your ear.  You have loss of taste.  Your pain is not relieved with prescribed medicines.   Your redness or swelling spreads.   You have more pain and swelling.  Your condition is worsening or has changed.   You have a fever. MAKE SURE YOU:  Understand these instructions.  Will watch your condition.  Will get help right away if you are not doing well or get worse. Document Released: 04/22/2005 Document Revised: 09/06/2013 Document Reviewed: 12/05/2011 [facility] Patient Information 2015 [place], [place]. This information is not intended to replace advice given to you by your health care provider. Make sure you discuss any questions you have with your health care provider.

## 2014-05-28 NOTE — ED Notes (Signed)
Patients states that he is broken out out his left arm, under left arm pit, in between shoulder blades. Having a lot of pain per pt.

## 2014-05-28 NOTE — ED Notes (Signed)
Patient verbalizes understanding of discharge instructions, prescription medications, home care and follow up care. Patient ambulatory out of department at this time with family. 

## 2014-05-28 NOTE — ED Provider Notes (Signed)
CSN: [id number]     Arrival date & time 05/28/14  2046 History   First MD Initiated Contact with Patient 05/28/14 2055     Chief Complaint  Patient presents with  . Herpes Zoster     (Consider location/radiation/quality/duration/timing/severity/associated sxs/prior Treatment) Patient is a 39 y.o. male presenting with rash. The history is provided by the patient.  Rash Location:  Shoulder/arm Shoulder/arm rash location:  L axilla Quality: blistering, burning and painful   Pain details:    Severity:  Severe   Onset quality:  Gradual   Duration:  2 weeks   Timing:  Constant   Progression:  Worsening Severity:  Moderate Chronicity:  New Relieved by:  None tried Worsened by:  Nothing tried Ineffective treatments:  None tried  Adam Pierce is a 39 y.o. male who presents to the ED with a painful rash to the left axilla and goes to the back and down the left arm on the palmar aspect.   Past Medical History  Diagnosis Date  . Inguinal hernia   . GERD (gastroesophageal reflux disease)   . Substance abuse   . Depression    Past Surgical History  Procedure Laterality Date  . Hernia repair Bilateral 1978    6-8 mos old  . Inguinal hernia repair Left 03/18/2013    Procedure: LAPAROSCOPIC INGUINAL HERNIA WITH MESH;  Surgeon: Lodema PilotBrian Layton, DO;  Location: [facility];  Service: General;  Laterality: Left;  . Insertion of mesh Left 03/18/2013    Procedure: INSERTION OF MESH;  Surgeon: Lodema PilotBrian Layton, DO;  Location: [facility];  Service: General;  Laterality: Left;   History reviewed. No pertinent family history. History  Substance Use Topics  . Smoking status: Current Every Day Smoker -- 1.00 packs/day for 15 years    Types: Cigarettes  . Smokeless tobacco: Never Used  . Alcohol Use: Yes     Comment: daily    Review of Systems  Skin: Positive for rash.   All other systems negative   Allergies  Review of patient's allergies indicates no known allergies.  Home Medications   Prior  to Admission medications   Medication Sig Start Date End Date Taking? Authorizing Provider  naproxen sodium (ANAPROX) 220 MG tablet Take 220-440 mg by mouth 2 (two) times daily with a meal.   Yes Historical Provider, MD  ranitidine (ZANTAC) 150 MG tablet Take 150 mg by mouth daily.   Yes Historical Provider, MD  acyclovir (ZOVIRAX) 800 MG tablet Take 1 tablet (800 mg total) by mouth 5 (five) times daily. 05/28/14   Hope Orlene OchM Neese, NP  naproxen (NAPROSYN) 500 MG tablet Take 1 tablet (500 mg total) by mouth 2 (two) times daily. 05/28/14   Hope Orlene OchM Neese, NP  traMADol (ULTRAM) 50 MG tablet Take 1 tablet (50 mg total) by mouth every 6 (six) hours as needed. 05/28/14   Hope Orlene OchM Neese, NP   BP 125/73 mmHg  Pulse 88  Temp(Src) 98.2 F (36.8 C) (Oral)  Resp 20  Ht 6\' 2"  (1.88 m)  Wt 170 lb (77.111 kg)  BMI 21.82 kg/m2  SpO2 99% Physical Exam  Constitutional: He is oriented to person, place, and time. He appears well-developed and well-nourished. No distress.  HENT:  Head: Normocephalic.  Eyes: Conjunctivae and EOM are normal.  Neck: Normal range of motion. Neck supple.  Cardiovascular: Normal rate and regular rhythm.   Pulmonary/Chest: Effort normal and breath sounds normal.  Abdominal: Soft. There is no tenderness.  Musculoskeletal: Normal range of  motion.       Arms: Raised, red, vesicular rash to left arm, axilla and back.   Neurological: He is alert and oriented to person, place, and time. No cranial nerve deficit.  Skin: Skin is warm and dry.  Psychiatric: He has a normal mood and affect. His behavior is normal.  Nursing note and vitals reviewed.   ED Course  Procedures (  MDM  39 y.o. male with rash and burning sensation. Will treat for Zoster. Discussed with the patient clinical findings and plan of care. All questioned fully answered. He will return if any problems arise. Stable for discharge without fever or other problems at this time.   Final diagnoses:  Zoster     Janne Napoleon, NP 06/01/14 1610  Gilda Crease, MD 06/01/14 1537

## 2014-06-10 ENCOUNTER — Encounter (HOSPITAL_COMMUNITY): Payer: Self-pay | Admitting: *Deleted

## 2014-06-10 ENCOUNTER — Emergency Department (HOSPITAL_COMMUNITY)
Admission: EM | Admit: 2014-06-10 | Discharge: 2014-06-11 | Disposition: A | Discharge status: Home / Self Care | Payer: Self-pay | Attending: Emergency Medicine | Admitting: Emergency Medicine

## 2014-06-10 DIAGNOSIS — K219 Gastro-esophageal reflux disease without esophagitis: Secondary | ICD-10-CM | POA: Insufficient documentation

## 2014-06-10 DIAGNOSIS — Z720 Tobacco use: Secondary | ICD-10-CM | POA: Insufficient documentation

## 2014-06-10 DIAGNOSIS — Z79899 Other long term (current) drug therapy: Secondary | ICD-10-CM | POA: Insufficient documentation

## 2014-06-10 DIAGNOSIS — Y9389 Activity, other specified: Secondary | ICD-10-CM | POA: Insufficient documentation

## 2014-06-10 DIAGNOSIS — T1502XA Foreign body in cornea, left eye, initial encounter: Secondary | ICD-10-CM | POA: Insufficient documentation

## 2014-06-10 DIAGNOSIS — T1592XA Foreign body on external eye, part unspecified, left eye, initial encounter: Secondary | ICD-10-CM

## 2014-06-10 DIAGNOSIS — X58XXXA Exposure to other specified factors, initial encounter: Secondary | ICD-10-CM | POA: Insufficient documentation

## 2014-06-10 DIAGNOSIS — Z8659 Personal history of other mental and behavioral disorders: Secondary | ICD-10-CM | POA: Insufficient documentation

## 2014-06-10 DIAGNOSIS — Z791 Long term (current) use of non-steroidal anti-inflammatories (NSAID): Secondary | ICD-10-CM | POA: Insufficient documentation

## 2014-06-10 DIAGNOSIS — Y998 Other external cause status: Secondary | ICD-10-CM | POA: Insufficient documentation

## 2014-06-10 DIAGNOSIS — S0502XA Injury of conjunctiva and corneal abrasion without foreign body, left eye, initial encounter: Secondary | ICD-10-CM

## 2014-06-10 DIAGNOSIS — Y9289 Other specified places as the place of occurrence of the external cause: Secondary | ICD-10-CM | POA: Insufficient documentation

## 2014-06-10 MED ORDER — TETRACAINE HCL 0.5 % OP SOLN
1.0000 [drp] | Freq: Once | OPHTHALMIC | Status: AC
  Administered 2014-06-11: 1 [drp] via OPHTHALMIC
  Filled 2014-06-10: qty 2

## 2014-06-10 MED ORDER — FLUORESCEIN SODIUM 1 MG OP STRP
1.0000 | ORAL_STRIP | Freq: Once | OPHTHALMIC | Status: AC
  Administered 2014-06-11: 1 via OPHTHALMIC
  Filled 2014-06-10: qty 1

## 2014-06-10 NOTE — ED Notes (Signed)
Pt reporting grinding cast iron pipe earlier today and believes a shaving became lodged in left eye.

## 2014-06-10 NOTE — ED Provider Notes (Signed)
CSN: [id number]     Arrival date & time 06/10/14  2322 History   First MD Initiated Contact with Patient 06/10/14 2334     No chief complaint on file.    (Consider location/radiation/quality/duration/timing/severity/associated sxs/prior Treatment) Patient is a 39 y.o. male presenting with foreign body in eye. The history is provided by the patient.  Foreign Body in Eye This is a new problem. The current episode started today. The problem occurs constantly. The problem has been gradually worsening. Exacerbated by: blinking eye. He has tried nothing for the symptoms.   Adam Pierce is a 39 y.o. male who presents to the ED with possible foreign body to the left eye. He states that he was grinding cast iron pipe earlier today and believes a shaving got in his eye. He denies any other problems. Patient's daughter is in nursing school and prior to coming to the ED she irrigated his eye with water and flipped his upper lid to check for foreign body. She then identified a tiny black speck on the cornea @ 5 o'clock and used a cotton tip applicator and removed a small speck. The patient complains of continued irritation and feeling of foreign body.   Past Medical History  Diagnosis Date  . Inguinal hernia   . GERD (gastroesophageal reflux disease)   . Substance abuse   . Depression    Past Surgical History  Procedure Laterality Date  . Hernia repair Bilateral 1978    6-8 mos old  . Inguinal hernia repair Left 03/18/2013    Procedure: LAPAROSCOPIC INGUINAL HERNIA WITH MESH;  Surgeon: Lodema PilotBrian Layton, DO;  Location: [facility];  Service: General;  Laterality: Left;  . Insertion of mesh Left 03/18/2013    Procedure: INSERTION OF MESH;  Surgeon: Lodema PilotBrian Layton, DO;  Location: [facility];  Service: General;  Laterality: Left;   History reviewed. No pertinent family history. History  Substance Use Topics  . Smoking status: Current Every Day Smoker -- 1.00 packs/day for 15 years    Types: Cigarettes  .  Smokeless tobacco: Never Used  . Alcohol Use: Yes     Comment: daily    Review of Systems Negative except as stated in HPI   Allergies  Review of patient's allergies indicates no known allergies.  Home Medications   Prior to Admission medications   Medication Sig Start Date End Date Taking? Authorizing Provider  acyclovir (ZOVIRAX) 800 MG tablet Take 1 tablet (800 mg total) by mouth 5 (five) times daily. 05/28/14   Hope Orlene OchM Neese, NP  HYDROcodone-acetaminophen (NORCO/VICODIN) 5-325 MG per tablet Take 1 tablet by mouth every 4 (four) hours as needed. 06/11/14   Hope Orlene OchM Neese, NP  naproxen (NAPROSYN) 500 MG tablet Take 1 tablet (500 mg total) by mouth 2 (two) times daily. 05/28/14   Hope Orlene OchM Neese, NP  naproxen sodium (ANAPROX) 220 MG tablet Take 220-440 mg by mouth 2 (two) times daily with a meal.    Historical Provider, MD  ranitidine (ZANTAC) 150 MG tablet Take 150 mg by mouth daily.    Historical Provider, MD   BP 136/76 mmHg  Pulse 81  Temp(Src) 98 F (36.7 C) (Oral)  Resp 18  Ht 6\' 2"  (1.88 m)  Wt 170 lb (77.111 kg)  BMI 21.82 kg/m2  SpO2 100% Physical Exam  Constitutional: He is oriented to person, place, and time. He appears well-developed and well-nourished.  HENT:  Head: Normocephalic and atraumatic.  Eyes: EOM and lids are normal. Pupils are equal, round,  and reactive to light. Lids are everted and swept, no foreign bodies found. Left conjunctiva is injected.  Slit lamp exam:      The left eye shows corneal abrasion and fluorescein uptake.    Left eye @ 5 o'clock uptake and rust ring.   Neck: Neck supple.  Cardiovascular: Normal rate.   Pulmonary/Chest: Effort normal.  Musculoskeletal: Normal range of motion.  Neurological: He is alert and oriented to person, place, and time. No cranial nerve deficit.  Skin: Skin is warm and dry.  Psychiatric: He has a normal mood and affect. His behavior is normal.  Nursing note and vitals reviewed.   ED Course  Procedures    Tetracaine opth. 2 drops applied to left eye, visual acuity, eye stained and examined with Joseph Art Lamp and corneal abrasion noted. Pain management, antibiotic eye drops with first dose instilled prior to d/c.  Slit lamp exam preformed by me and then by Dr. Preston Fleeting. Abrasion and rust ring identified.  MDM  39 y.o. male with left eye irritation after grinding iron today. Dr. Preston Fleeting determined that the area that is showing on the cornea is a rust ring and not a foreign body. Discussed with the patient clinical findings and plan of care. All questioned fully answered. He will follow up with opthalmology on Monday or return here if any problems arise. Stable for discharge without visual change or any immediate complications.   Final diagnoses:  Foreign body of left eye, initial encounter  Corneal abrasion, left, initial encounter      [facility], NP 06/11/14 1703  Dione Booze, MD 06/11/14 [phone]

## 2014-06-11 MED ORDER — FLUORESCEIN SODIUM 1 MG OP STRP
ORAL_STRIP | OPHTHALMIC | Status: AC
  Filled 2014-06-11: qty 1

## 2014-06-11 MED ORDER — HYDROCODONE-ACETAMINOPHEN 5-325 MG PO TABS: 1.0000 | ORAL_TABLET | ORAL | Status: AC | PRN

## 2014-06-11 MED ORDER — TOBRAMYCIN 0.3 % OP SOLN
2.0000 [drp] | OPHTHALMIC | Status: DC
  Administered 2014-06-11: 2 [drp] via OPHTHALMIC
  Filled 2014-06-11: qty 5

## 2014-06-11 MED ORDER — TETRACAINE HCL 0.5 % OP SOLN
OPHTHALMIC | Status: AC
  Filled 2014-06-11: qty 2

## 2014-06-11 MED ORDER — OXYCODONE-ACETAMINOPHEN 5-325 MG PO TABS: 2.0000 | ORAL_TABLET | ORAL | Status: AC | PRN

## 2014-06-11 NOTE — Discharge Instructions (Signed)
You can take ibuprofen in addition to the medications we give you. Do not take the narcotic if driving as it will make you sleepy. Follow up with Dr. Lita MainsHaines on Monday for further evaluation of the abrasion and the rust ring. Return here for worsening symptoms.

## 2014-06-13 MED FILL — Oxycodone w/ Acetaminophen Tab 5-325 MG: ORAL | Qty: 6 | Status: AC

## 2014-10-05 DEATH — deceased
